# Patient Record
Sex: Female | Born: 1980
Health system: Southern US, Community
[De-identification: ages and names within clinical notes are randomized; demographics above are authoritative.]

## PROBLEM LIST (undated history)

## (undated) DIAGNOSIS — L309 Dermatitis, unspecified: Secondary | ICD-10-CM

## (undated) DIAGNOSIS — D649 Anemia, unspecified: Secondary | ICD-10-CM

## (undated) DIAGNOSIS — D219 Benign neoplasm of connective and other soft tissue, unspecified: Secondary | ICD-10-CM

## (undated) DIAGNOSIS — A749 Chlamydial infection, unspecified: Secondary | ICD-10-CM

## (undated) DIAGNOSIS — G43909 Migraine, unspecified, not intractable, without status migrainosus: Secondary | ICD-10-CM

## (undated) DIAGNOSIS — IMO0002 Reserved for concepts with insufficient information to code with codable children: Secondary | ICD-10-CM

## (undated) HISTORY — DX: Migraine, unspecified, not intractable, without status migrainosus: G43.909

## (undated) HISTORY — DX: Chlamydial infection, unspecified: A74.9

## (undated) HISTORY — DX: Reserved for concepts with insufficient information to code with codable children: IMO0002

## (undated) HISTORY — PX: DENTAL SURGERY: SHX609

## (undated) HISTORY — PX: INCISION AND DRAINAGE BREAST ABSCESS: SUR672

## (undated) HISTORY — DX: Dermatitis, unspecified: L30.9

---

## 2000-07-07 ENCOUNTER — Encounter: Payer: Self-pay | Admitting: Emergency Medicine

## 2000-07-07 ENCOUNTER — Emergency Department (HOSPITAL_COMMUNITY): Admission: EM | Admit: 2000-07-07 | Discharge: 2000-07-07 | Payer: Self-pay | Admitting: Emergency Medicine

## 2002-08-07 ENCOUNTER — Encounter: Admission: RE | Admit: 2002-08-07 | Discharge: 2002-08-07 | Payer: Self-pay | Admitting: Obstetrics and Gynecology

## 2002-08-07 ENCOUNTER — Encounter (INDEPENDENT_AMBULATORY_CARE_PROVIDER_SITE_OTHER): Payer: Self-pay | Admitting: *Deleted

## 2002-08-13 ENCOUNTER — Ambulatory Visit (HOSPITAL_COMMUNITY): Admission: RE | Admit: 2002-08-13 | Discharge: 2002-08-13 | Payer: Self-pay | Admitting: Obstetrics and Gynecology

## 2002-09-11 ENCOUNTER — Encounter: Admission: RE | Admit: 2002-09-11 | Discharge: 2002-09-11 | Payer: Self-pay | Admitting: Obstetrics and Gynecology

## 2002-12-03 ENCOUNTER — Encounter: Payer: Self-pay | Admitting: *Deleted

## 2002-12-03 ENCOUNTER — Ambulatory Visit (HOSPITAL_COMMUNITY): Admission: RE | Admit: 2002-12-03 | Discharge: 2002-12-03 | Payer: Self-pay | Admitting: *Deleted

## 2004-03-20 ENCOUNTER — Emergency Department (HOSPITAL_COMMUNITY): Admission: EM | Admit: 2004-03-20 | Discharge: 2004-03-21 | Payer: Self-pay | Admitting: Emergency Medicine

## 2005-09-04 ENCOUNTER — Emergency Department (HOSPITAL_COMMUNITY): Admission: EM | Admit: 2005-09-04 | Discharge: 2005-09-04 | Payer: Self-pay | Admitting: Emergency Medicine

## 2006-04-25 ENCOUNTER — Encounter: Admission: RE | Admit: 2006-04-25 | Discharge: 2006-04-25 | Payer: Self-pay | Admitting: Internal Medicine

## 2007-06-13 ENCOUNTER — Other Ambulatory Visit: Admission: RE | Admit: 2007-06-13 | Discharge: 2007-06-13 | Payer: Self-pay | Admitting: Gynecology

## 2007-09-11 ENCOUNTER — Ambulatory Visit (HOSPITAL_COMMUNITY): Admission: RE | Admit: 2007-09-11 | Discharge: 2007-09-11 | Payer: Self-pay | Admitting: Obstetrics and Gynecology

## 2008-07-16 ENCOUNTER — Encounter: Payer: Self-pay | Admitting: Gynecology

## 2008-07-16 ENCOUNTER — Ambulatory Visit: Payer: Self-pay | Admitting: Gynecology

## 2008-07-16 ENCOUNTER — Other Ambulatory Visit: Admission: RE | Admit: 2008-07-16 | Discharge: 2008-07-16 | Payer: Self-pay | Admitting: Gynecology

## 2008-08-03 HISTORY — PX: ABDOMINAL SURGERY: SHX537

## 2008-12-13 ENCOUNTER — Ambulatory Visit: Payer: Self-pay | Admitting: Gynecology

## 2009-01-17 ENCOUNTER — Ambulatory Visit: Payer: Self-pay | Admitting: Gynecology

## 2009-09-11 ENCOUNTER — Ambulatory Visit: Payer: Self-pay | Admitting: Gynecology

## 2009-09-11 ENCOUNTER — Other Ambulatory Visit: Admission: RE | Admit: 2009-09-11 | Discharge: 2009-09-11 | Payer: Self-pay | Admitting: Gynecology

## 2010-01-25 ENCOUNTER — Emergency Department (HOSPITAL_COMMUNITY): Admission: EM | Admit: 2010-01-25 | Discharge: 2010-01-25 | Payer: Self-pay | Admitting: Emergency Medicine

## 2010-01-27 ENCOUNTER — Emergency Department (HOSPITAL_COMMUNITY): Admission: EM | Admit: 2010-01-27 | Discharge: 2010-01-27 | Payer: Self-pay | Admitting: Emergency Medicine

## 2010-04-27 ENCOUNTER — Inpatient Hospital Stay (HOSPITAL_COMMUNITY)
Admission: AD | Admit: 2010-04-27 | Discharge: 2010-04-27 | Payer: Self-pay | Source: Home / Self Care | Attending: Gynecology | Admitting: Gynecology

## 2010-04-28 LAB — WET PREP, GENITAL: Trich, Wet Prep: NONE SEEN

## 2010-04-29 LAB — GC/CHLAMYDIA PROBE AMP, GENITAL
Chlamydia, DNA Probe: NEGATIVE
GC Probe Amp, Genital: NEGATIVE

## 2010-06-17 LAB — URINE MICROSCOPIC-ADD ON

## 2010-06-17 LAB — URINALYSIS, ROUTINE W REFLEX MICROSCOPIC
Glucose, UA: NEGATIVE mg/dL
Ketones, ur: 15 mg/dL — AB
Leukocytes, UA: NEGATIVE
Nitrite: NEGATIVE
Protein, ur: 100 mg/dL — AB
Specific Gravity, Urine: 1.034 — ABNORMAL HIGH (ref 1.005–1.030)
Urobilinogen, UA: 1 mg/dL (ref 0.0–1.0)
pH: 6 (ref 5.0–8.0)

## 2010-06-17 LAB — CBC
HCT: 36.1 % (ref 36.0–46.0)
Hemoglobin: 11.9 g/dL — ABNORMAL LOW (ref 12.0–15.0)
MCH: 27.4 pg (ref 26.0–34.0)
MCHC: 33 g/dL (ref 30.0–36.0)
MCV: 83 fL (ref 78.0–100.0)
Platelets: 368 K/uL (ref 150–400)
RBC: 4.35 MIL/uL (ref 3.87–5.11)
RDW: 13.4 % (ref 11.5–15.5)
WBC: 14.2 K/uL — ABNORMAL HIGH (ref 4.0–10.5)

## 2010-06-17 LAB — DIFFERENTIAL
Basophils Absolute: 0 K/uL (ref 0.0–0.1)
Basophils Relative: 0 % (ref 0–1)
Eosinophils Absolute: 0 K/uL (ref 0.0–0.7)
Eosinophils Relative: 0 % (ref 0–5)
Lymphocytes Relative: 24 % (ref 12–46)
Lymphs Abs: 3.4 K/uL (ref 0.7–4.0)
Monocytes Absolute: 1 K/uL (ref 0.1–1.0)
Monocytes Relative: 7 % (ref 3–12)
Neutro Abs: 9.8 K/uL — ABNORMAL HIGH (ref 1.7–7.7)
Neutrophils Relative %: 69 % (ref 43–77)

## 2010-06-17 LAB — BASIC METABOLIC PANEL
BUN: 8 mg/dL (ref 6–23)
CO2: 24 mEq/L (ref 19–32)
Chloride: 105 mEq/L (ref 96–112)
Creatinine, Ser: 0.73 mg/dL (ref 0.4–1.2)
Glucose, Bld: 132 mg/dL — ABNORMAL HIGH (ref 70–99)
Potassium: 3.6 mEq/L (ref 3.5–5.1)

## 2010-09-28 ENCOUNTER — Encounter: Payer: Self-pay | Admitting: Gynecology

## 2010-10-22 ENCOUNTER — Encounter: Payer: Self-pay | Admitting: Gynecology

## 2010-10-28 IMAGING — CT CT NECK W/ CM
1 of 4 series · 7 of 14 positions shown, 9 images · IV contrast (100ml omni 300)
Comparison: None.
COMPARISON: None.

CLINICAL DATA: Right face and neck pain and swelling for the past
5 days.  Increasing dysphagia.  No improvement with antibiotics or
steroid injection.

CT MAXILLOFACIAL WITH CONTRAST
TECHNIQUE: Multidetector CT imaging of the maxillofacial structure
s was performed with
intravenous contrast. Multiplanar CT image reconstructions were als
o generated.  A small metallic BB was placed on the right temple in
order to reliably differentiate right from left.
Contrast:  100 ml Gmnipaque-6MM
TECHNIQUE: Multidetector CT imaging of the neck was performed
using the standard protocol following the bolus administration of
intravenous contrast.
Contrast: 100 ml Gmnipaque-6MM (the same given for the
maxillofacial CT).

[Series 100: maxillofacial · axial · 0.39mm/px · z∈[-349,-166]mm · 7 of 390 slices shown, 9 images]
[im 49/390  soft-tissue]
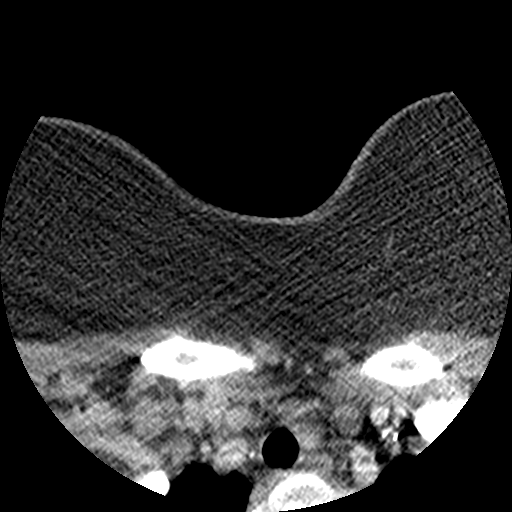
[im 49/390  bone]
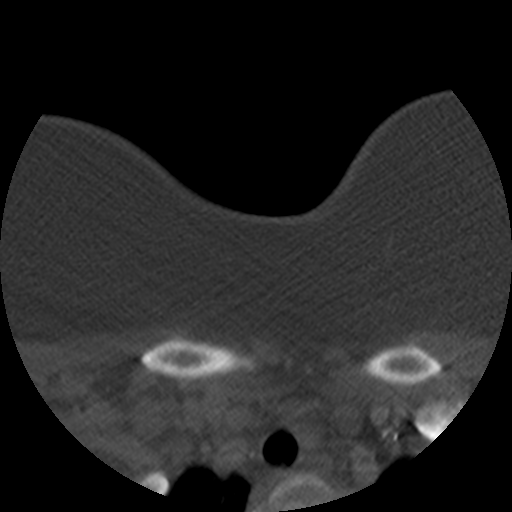
[im 98/390  bone]
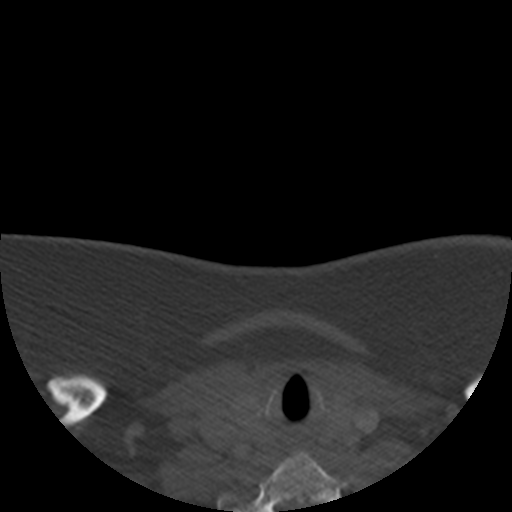
[im 146/390  bone]
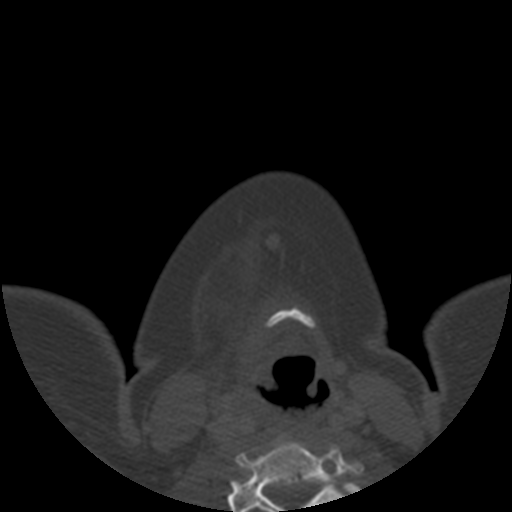
[im 195/390  bone]
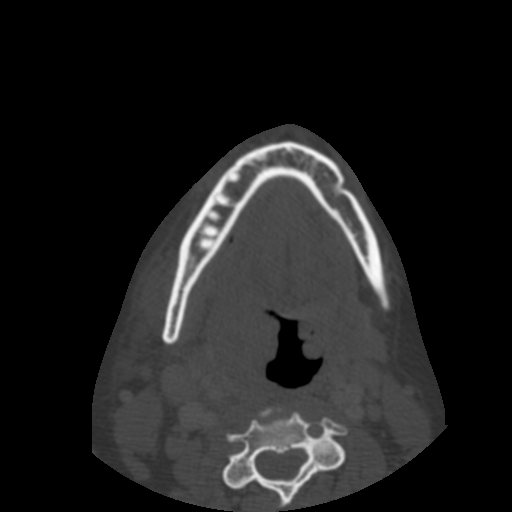
[im 244/390  soft-tissue]
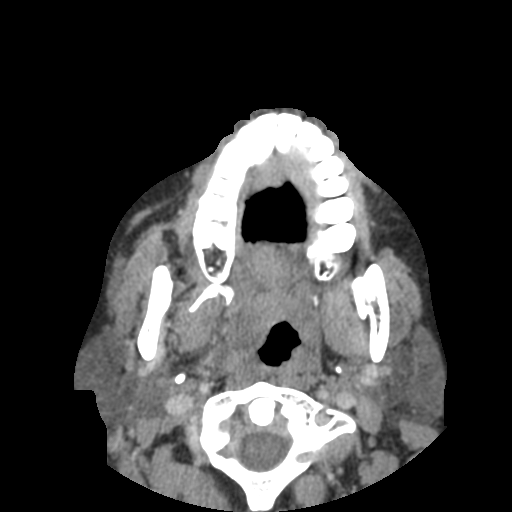
[im 244/390  bone]
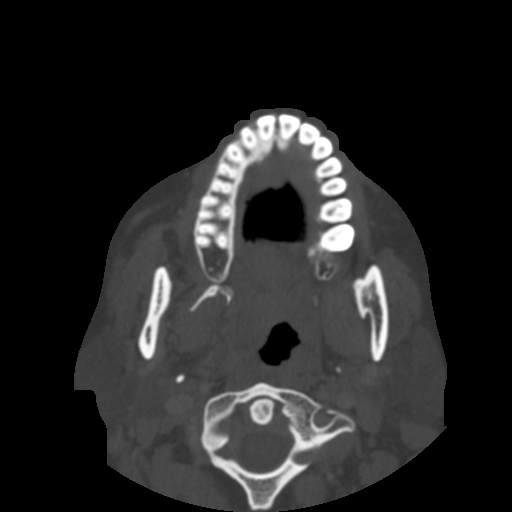
[im 292/390  bone]
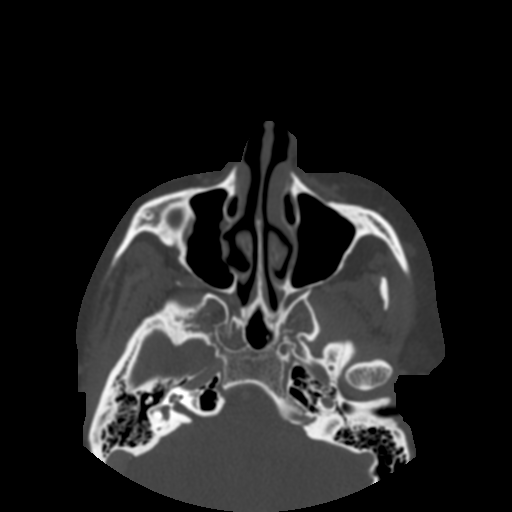
[im 341/390  bone]
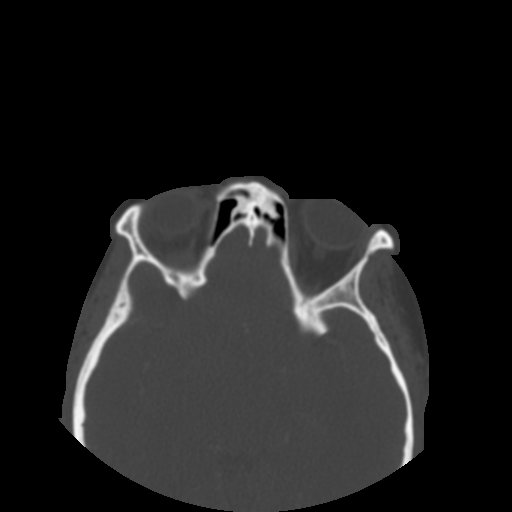

[7 of 14 positions shown; findings below may reference images not displayed]

FINDINGS: Normal appearing maxillofacial bones and soft tissues.
No visible soft tissue swelling.  No fluid collections.  The
paranasal sinuses are normally pneumatized.
IMPRESSION: Normal examination.

CT NECK WITH CONTRAST
FINDINGS: Soft tissue swelling in the region of the right tonsillar
pillar with an oval, central area of low density, measuring 1.1 x
1.0 cm in maximum dimensions on image number 188.  This measures
0.9 cm in length on sagittal reconstruction image number 32.  There
are also prominent left tonsillar soft tissues without abscess.
There is airway narrowing at the level of the posterior oropharynx.
No prevertebral soft  tissue swelling.  Bilateral enlarged
jugulodigastric lymph nodes.  The largest node on the right
measures 2.4 x 1.8 cm in maximum dimensions on image number 209 and
the largest node on the left measures 2.3 x 1.2 cm in maximum
dimensions on image number 174.
IMPRESSION: 1.  Right tonsillitis with a 1.1 cm tonsillar abscess.
2.  Prominent left tonsillar soft tissues without abscess.
3.  Bilateral neck reactive adenopathy.

## 2011-12-05 ENCOUNTER — Emergency Department (HOSPITAL_BASED_OUTPATIENT_CLINIC_OR_DEPARTMENT_OTHER)
Admission: EM | Admit: 2011-12-05 | Discharge: 2011-12-05 | Disposition: A | Discharge status: Home / Self Care | Payer: Self-pay | Attending: Emergency Medicine | Admitting: Emergency Medicine

## 2011-12-05 ENCOUNTER — Encounter (HOSPITAL_BASED_OUTPATIENT_CLINIC_OR_DEPARTMENT_OTHER): Payer: Self-pay | Admitting: *Deleted

## 2011-12-05 DIAGNOSIS — J45909 Unspecified asthma, uncomplicated: Secondary | ICD-10-CM | POA: Insufficient documentation

## 2011-12-05 DIAGNOSIS — J45901 Unspecified asthma with (acute) exacerbation: Secondary | ICD-10-CM

## 2011-12-05 DIAGNOSIS — I10 Essential (primary) hypertension: Secondary | ICD-10-CM | POA: Insufficient documentation

## 2011-12-05 MED ORDER — PREDNISONE 50 MG PO TABS
60.0000 mg | ORAL_TABLET | Freq: Once | ORAL | Status: AC
  Administered 2011-12-05: 60 mg via ORAL
  Filled 2011-12-05: qty 1

## 2011-12-05 MED ORDER — FLUTICASONE PROPIONATE HFA 220 MCG/ACT IN AERO: 1.0000 | INHALATION_SPRAY | Freq: Two times a day (BID) | RESPIRATORY_TRACT | Status: DC

## 2011-12-05 MED ORDER — PREDNISONE 10 MG PO TABS: 20.0000 mg | ORAL_TABLET | Freq: Every day | ORAL | Status: DC

## 2011-12-05 NOTE — ED Notes (Signed)
Pt has hx of asthma and it has been "flaring up" for the last several days. Used Albuterol, but has run out of Flovent. Prod cough with clr-white sputum. BBS-slightly diminished at triage, but pt is in no distress.

## 2011-12-05 NOTE — ED Provider Notes (Addendum)
History     CSN: [id number]  Arrival date & time 12/05/11  1756   First MD Initiated Contact with Patient 12/05/11 1856      Chief Complaint  Patient presents with  . Shortness of Breath    (Consider location/radiation/quality/duration/timing/severity/associated sxs/prior treatment) HPI  Past Medical History  Diagnosis Date  . Asthma    Patient complaining of flare of asthma for several days.  Using albuterol with some relief.  OUt of flovent.  No respirator distress Past Surgical History  Procedure Date  . Abdominal surgery 05/10    ABDOMINAL MYOMECTOMY    Family History  Problem Relation Age of Onset  . Heart disease Father   . Hypertension Father   . Diabetes Father   . Hypertension Sister     History  Substance Use Topics  . Smoking status: Unknown If Ever Smoked  . Smokeless tobacco: Not on file  . Alcohol Use:     OB History    Grav Para Term Preterm Abortions TAB SAB Ect Mult Living                  Review of Systems  Constitutional: Negative for fever, chills, activity change, appetite change and unexpected weight change.  HENT: Negative for sore throat, rhinorrhea, neck pain, neck stiffness and sinus pressure.   Eyes: Negative for visual disturbance.  Respiratory: Positive for cough and wheezing. Negative for shortness of breath.   Cardiovascular: Negative for chest pain and leg swelling.  Gastrointestinal: Negative for vomiting, abdominal pain, diarrhea and blood in stool.  Genitourinary: Negative for dysuria, urgency, frequency, vaginal discharge and difficulty urinating.  Musculoskeletal: Negative for myalgias, arthralgias and gait problem.  Skin: Negative for color change and rash.  Neurological: Negative for weakness, light-headedness and headaches.  Hematological: Does not bruise/bleed easily.  Psychiatric/Behavioral: Negative for dysphoric mood.    Allergies  Review of patient's allergies indicates no known allergies.  Home Medications    Current Outpatient Rx  Name Route Sig Dispense Refill  . ADVIL COLD/SINUS PO Oral Take 1 capsule by mouth daily as needed. For cold symptoms.    Marland Kitchen PSEUDOEPHEDRINE-IBUPROFEN 30-200 MG PO TABS Oral Take by mouth.    Marland Kitchen FLUTICASONE PROPIONATE  HFA 220 MCG/ACT IN AERO Inhalation Inhale 1 puff into the lungs 2 (two) times daily. 1 Inhaler 12  . LORATADINE 10 MG PO TABS Oral Take 10 mg by mouth daily.      Marland Kitchen PREDNISONE 10 MG PO TABS Oral Take 2 tablets (20 mg total) by mouth daily. 15 tablet 0    BP 157/103  Pulse 85  Temp 98.3 F (36.8 C) (Oral)  Resp 20  Ht 5\' 7"  (1.702 m)  Wt 320 lb (145.151 kg)  BMI 50.12 kg/m2  SpO2 99%  LMP 11/25/2011  Physical Exam  Nursing note and vitals reviewed. Constitutional: She appears well-developed and well-nourished.  HENT:  Head: Normocephalic and atraumatic.  Eyes: Conjunctivae normal and EOM are normal. Pupils are equal, round, and reactive to light.  Neck: Normal range of motion. Neck supple.  Cardiovascular: Normal rate, regular rhythm, normal heart sounds and intact distal pulses.   Pulmonary/Chest: Effort normal and breath sounds normal.  Abdominal: Soft. Bowel sounds are normal.  Musculoskeletal: Normal range of motion.  Neurological: She is alert.  Skin: Skin is warm and dry.  Psychiatric: She has a normal mood and affect. Thought content normal.    ED Course  Procedures (including critical care time)   Labs Reviewed  PREGNANCY, URINE   No results found.   1. Asthma attack   2. Hypertension       MDM       Hilario Quarry, MD 12/05/11 1924  Hilario Quarry, MD 12/05/11 9562  Hilario Quarry, MD 02/04/12 1308

## 2013-09-21 ENCOUNTER — Encounter (HOSPITAL_COMMUNITY): Payer: Self-pay | Admitting: *Deleted

## 2013-09-21 ENCOUNTER — Inpatient Hospital Stay (HOSPITAL_COMMUNITY)
Admission: AD | Admit: 2013-09-21 | Discharge: 2013-09-21 | Disposition: A | Discharge status: Home / Self Care | Payer: BC Managed Care – PPO | Source: Ambulatory Visit | Attending: Obstetrics & Gynecology | Admitting: Obstetrics & Gynecology

## 2013-09-21 DIAGNOSIS — R300 Dysuria: Secondary | ICD-10-CM | POA: Insufficient documentation

## 2013-09-21 DIAGNOSIS — Z3202 Encounter for pregnancy test, result negative: Secondary | ICD-10-CM | POA: Insufficient documentation

## 2013-09-21 DIAGNOSIS — E860 Dehydration: Secondary | ICD-10-CM

## 2013-09-21 LAB — URINALYSIS, ROUTINE W REFLEX MICROSCOPIC
Bilirubin Urine: NEGATIVE
Glucose, UA: NEGATIVE mg/dL
Hgb urine dipstick: NEGATIVE
KETONES UR: 15 mg/dL — AB
LEUKOCYTES UA: NEGATIVE
NITRITE: NEGATIVE
PH: 6 (ref 5.0–8.0)
Protein, ur: NEGATIVE mg/dL
SPECIFIC GRAVITY, URINE: 1.025 (ref 1.005–1.030)
UROBILINOGEN UA: 1 mg/dL (ref 0.0–1.0)

## 2013-09-21 LAB — WET PREP, GENITAL
Clue Cells Wet Prep HPF POC: NONE SEEN
TRICH WET PREP: NONE SEEN
Yeast Wet Prep HPF POC: NONE SEEN

## 2013-09-21 LAB — POCT PREGNANCY, URINE: Preg Test, Ur: NEGATIVE

## 2013-09-21 NOTE — Discharge Instructions (Signed)
Dehydration, Adult Dehydration is when you lose more fluids from the body than you take in. Vital organs like the kidneys, brain, and heart cannot function without a proper amount of fluids and salt. Any loss of fluids from the body can cause dehydration.  CAUSES   Vomiting.  Diarrhea.  Excessive sweating.  Excessive urine output.  Fever. SYMPTOMS  Mild dehydration  Thirst.  Dry lips.  Slightly dry mouth. Moderate dehydration  Very dry mouth.  Sunken eyes.  Skin does not bounce back quickly when lightly pinched and released.  Dark urine and decreased urine production.  Decreased tear production.  Headache. Severe dehydration  Very dry mouth.  Extreme thirst.  Rapid, weak pulse (more than 100 beats per minute at rest).  Cold hands and feet.  Not able to sweat in spite of heat and temperature.  Rapid breathing.  Blue lips.  Confusion and lethargy.  Difficulty being awakened.  Minimal urine production.  No tears. DIAGNOSIS  Your caregiver will diagnose dehydration based on your symptoms and your exam. Blood and urine tests will help confirm the diagnosis. The diagnostic evaluation should also identify the cause of dehydration. TREATMENT  Treatment of mild or moderate dehydration can often be done at home by increasing the amount of fluids that you drink. It is best to drink small amounts of fluid more often. Drinking too much at one time can make vomiting worse. Refer to the home care instructions below. Severe dehydration needs to be treated at the hospital where you will probably be given intravenous (IV) fluids that contain water and electrolytes. HOME CARE INSTRUCTIONS   Ask your caregiver about specific rehydration instructions.  Drink enough fluids to keep your urine clear or pale yellow.  Drink small amounts frequently if you have nausea and vomiting.  Eat as you normally do.  Avoid:  Foods or drinks high in sugar.  Carbonated  drinks.  Juice.  Extremely hot or cold fluids.  Drinks with caffeine.  Fatty, greasy foods.  Alcohol.  Tobacco.  Overeating.  Gelatin desserts.  Wash your hands well to avoid spreading bacteria and viruses.  Only take over-the-counter or prescription medicines for pain, discomfort, or fever as directed by your caregiver.  Ask your caregiver if you should continue all prescribed and over-the-counter medicines.  Keep all follow-up appointments with your caregiver. SEEK MEDICAL CARE IF:  You have abdominal pain and it increases or stays in one area (localizes).  You have a rash, stiff neck, or severe headache.  You are irritable, sleepy, or difficult to awaken.  You are weak, dizzy, or extremely thirsty. SEEK IMMEDIATE MEDICAL CARE IF:   You are unable to keep fluids down or you get worse despite treatment.  You have frequent episodes of vomiting or diarrhea.  You have blood or green matter (bile) in your vomit.  You have blood in your stool or your stool looks black and tarry.  You have not urinated in 6 to 8 hours, or you have only urinated a small amount of very dark urine.  You have a fever.  You faint. MAKE SURE YOU:   Understand these instructions.  Will watch your condition.  Will get help right away if you are not doing well or get worse. Document Released: 03/22/2005 Document Revised: 06/14/2011 Document Reviewed: 11/09/2010 [employer] Patient Information 2015 [employer]. This information is not intended to replace advice given to you by your health care provider. Make sure you discuss any questions you have with your health care   provider.  

## 2013-09-21 NOTE — MAU Provider Note (Signed)
History     CSN: [id number]  Arrival date and time: 09/21/13 1925   First Provider Initiated Contact with Patient 09/21/13 2018         Chief Complaint  Patient presents with  . Dysuria   Patient is a 33 y.o. female presenting with dysuria.  Dysuria  Pertinent negatives include no chills, no nausea, no vomiting, no frequency and no urgency.   Pt is not pregnant and presents with dark urine and some pain with urination.  Pt denies vaginal discharge  Or abnormal bleeding.  Pt says she voids adequate amounts and does not have frequency.  Pt denies chills, Fever, or flank pain.   Glynda Jaeger, RN Registered Nurse Signed  MAU Note Service date: 09/21/2013 7:49 PM   For couple days urine very dark and pain when uriate. Symptoms for couple days. I drink lots of water     Past Medical History  Diagnosis Date  . Asthma     Past Surgical History  Procedure Laterality Date  . Abdominal surgery  05/10    ABDOMINAL MYOMECTOMY  . Uterine fibroid surgery  2010    Family History  Problem Relation Age of Onset  . Heart disease Father   . Hypertension Father   . Diabetes Father   . Hypertension Sister     History  Substance Use Topics  . Smoking status: Never Smoker   . Smokeless tobacco: Not on file  . Alcohol Use: No    Allergies: No Known Allergies  Prescriptions prior to admission  Medication Sig Dispense Refill  . albuterol (PROVENTIL HFA;VENTOLIN HFA) 108 (90 BASE) MCG/ACT inhaler Inhale 2 puffs into the lungs every 4 (four) hours as needed for wheezing or shortness of breath.      . fluticasone (FLONASE) 50 MCG/ACT nasal spray Place 2 sprays into both nostrils daily as needed for allergies or rhinitis.      Marland Kitchen ibuprofen (ADVIL,MOTRIN) 200 MG tablet Take 400 mg by mouth every 6 (six) hours as needed for mild pain.        Review of Systems  Constitutional: Negative for fever and chills.  Gastrointestinal: Negative for nausea, vomiting, abdominal pain and diarrhea.   Genitourinary: Positive for dysuria. Negative for urgency and frequency.  Neurological: Negative for headaches.   Physical Exam   Blood pressure 144/93, pulse 94, temperature 98 F (36.7 C), resp. rate 20, height 5\' 8"  (1.727 m), weight 313 lb (141.976 kg), last menstrual period 09/09/2013.  Physical Exam  Nursing note and vitals reviewed. Constitutional: She is oriented to person, place, and time. She appears well-developed and well-nourished. No distress.  HENT:  Head: Normocephalic.  Eyes: Conjunctivae are normal. Pupils are equal, round, and reactive to light.  Neck: Normal range of motion. Neck supple.  Cardiovascular: Normal rate.   Respiratory: Effort normal.  GI: Soft.  Genitourinary:  Small amount of white thin discharge in vault; cervix clean; Bimanual- no palpable enlargement or tenderness - exam complicated by habitus  Musculoskeletal: Normal range of motion.  Neurological: She is alert and oriented to person, place, and time.  Skin: Skin is warm and dry.  Psychiatric: She has a normal mood and affect.   Results for orders placed during the hospital encounter of 09/21/13 (from the past 24 hour(s))  URINALYSIS, ROUTINE W REFLEX MICROSCOPIC     Status: Abnormal   Collection Time    09/21/13  7:53 PM      Result Value Ref Range   Color, Urine YELLOW  YELLOW  APPearance CLEAR  CLEAR   Specific Gravity, Urine 1.025  1.005 - 1.030   pH 6.0  5.0 - 8.0   Glucose, UA NEGATIVE  NEGATIVE mg/dL   Hgb urine dipstick NEGATIVE  NEGATIVE   Bilirubin Urine NEGATIVE  NEGATIVE   Ketones, ur 15 (*) NEGATIVE mg/dL   Protein, ur NEGATIVE  NEGATIVE mg/dL   Urobilinogen, UA 1.0  0.0 - 1.0 mg/dL   Nitrite NEGATIVE  NEGATIVE   Leukocytes, UA NEGATIVE  NEGATIVE  POCT PREGNANCY, URINE     Status: None   Collection Time    09/21/13  8:00 PM      Result Value Ref Range   Preg Test, Ur NEGATIVE  NEGATIVE  WET PREP, GENITAL     Status: Abnormal   Collection Time    09/21/13  8:31 PM       Result Value Ref Range   Yeast Wet Prep HPF POC NONE SEEN  NONE SEEN   Trich, Wet Prep NONE SEEN  NONE SEEN   Clue Cells Wet Prep HPF POC NONE SEEN  NONE SEEN   WBC, Wet Prep HPF POC FEW (*) NONE SEEN    MAU Course  Procedures Care turned over to [facility], [place] Sherolyn Buba 09/21/2013, 8:01 PM   2045 - Care assumed from Sherolyn Buba, NP UPT - negative UA, wet prep and GC/Chlamydia today  Assessment and Plan  A: Dehydration  P: Discharge home Patient advised to increased PO hydration as tolerated Patient may return to MAU as needed or if her condition were to change or worsen  Farris Has, PA-C 09/21/2013 9:02 PM

## 2013-09-21 NOTE — MAU Note (Signed)
For couple days urine very dark and pain when uriate. Symptoms for couple days. I drink lots of water

## 2013-09-21 NOTE — MAU Note (Signed)
Pt. Here for darker urine that she has had for the past few days. Pt. Also reports pain in urethra while urinating and after voiding. Pt. States she has been hydrating well with water and cranberry juice.

## 2013-09-22 LAB — GC/CHLAMYDIA PROBE AMP
CT Probe RNA: POSITIVE — AB
GC PROBE AMP APTIMA: NEGATIVE

## 2013-09-23 LAB — URINE CULTURE

## 2013-09-24 ENCOUNTER — Telehealth: Payer: Self-pay

## 2013-09-24 DIAGNOSIS — A749 Chlamydial infection, unspecified: Secondary | ICD-10-CM

## 2013-09-24 MED ORDER — AZITHROMYCIN 500 MG PO TABS: 1000.0000 mg | ORAL_TABLET | Freq: Once | ORAL | Status: DC

## 2013-09-24 NOTE — Telephone Encounter (Signed)
Message copied by Geanie Logan on Mon Sep 24, 2013 11:11 AM ------      Message from: Tarry Kos      Created: Mon Sep 24, 2013 10:29 AM       Patient returned call, notified her of positive chlamydia culture.  Patient has not been treated and will need Rx called in per protocol to [facility].  Instructed patient to notify her partner for treatment. ------

## 2013-09-24 NOTE — Telephone Encounter (Signed)
Zithromax 1gm PO prescribed. Patient called and informed. No questions or concerns.

## 2013-09-26 ENCOUNTER — Telehealth: Payer: Self-pay

## 2013-09-26 NOTE — Telephone Encounter (Signed)
Patient called requesting a RX for Diflucan as she was given an ABX Monday and frequently has yeast infections after ABX. Per chart review, she is not a patient of ours-- was seen in MAU 6/19 and we sent RX for Zithromax for treatment of chlamydia. Called patient to inform her that she should seek RX from PCP or use an OTC should she develop yeast infection. Patient verbalized understanding and gratitude. No questions or concerns.

## 2013-10-22 ENCOUNTER — Inpatient Hospital Stay (HOSPITAL_COMMUNITY)
Admission: AD | Admit: 2013-10-22 | Discharge: 2013-10-22 | Disposition: A | Discharge status: Home / Self Care | Payer: BC Managed Care – PPO | Source: Ambulatory Visit | Attending: Obstetrics & Gynecology | Admitting: Obstetrics & Gynecology

## 2013-10-22 ENCOUNTER — Encounter (HOSPITAL_COMMUNITY): Payer: Self-pay | Admitting: *Deleted

## 2013-10-22 DIAGNOSIS — R109 Unspecified abdominal pain: Secondary | ICD-10-CM | POA: Insufficient documentation

## 2013-10-22 DIAGNOSIS — B373 Candidiasis of vulva and vagina: Secondary | ICD-10-CM

## 2013-10-22 DIAGNOSIS — B3731 Acute candidiasis of vulva and vagina: Secondary | ICD-10-CM | POA: Insufficient documentation

## 2013-10-22 DIAGNOSIS — N390 Urinary tract infection, site not specified: Secondary | ICD-10-CM | POA: Insufficient documentation

## 2013-10-22 HISTORY — DX: Benign neoplasm of connective and other soft tissue, unspecified: D21.9

## 2013-10-22 LAB — URINALYSIS, ROUTINE W REFLEX MICROSCOPIC
BILIRUBIN URINE: NEGATIVE
Glucose, UA: NEGATIVE mg/dL
HGB URINE DIPSTICK: NEGATIVE
KETONES UR: NEGATIVE mg/dL
Nitrite: NEGATIVE
PROTEIN: NEGATIVE mg/dL
SPECIFIC GRAVITY, URINE: 1.015 (ref 1.005–1.030)
UROBILINOGEN UA: 2 mg/dL — AB (ref 0.0–1.0)
pH: 7.5 (ref 5.0–8.0)

## 2013-10-22 LAB — WET PREP, GENITAL
Trich, Wet Prep: NONE SEEN
WBC, Wet Prep HPF POC: NONE SEEN
Yeast Wet Prep HPF POC: NONE SEEN

## 2013-10-22 LAB — URINE MICROSCOPIC-ADD ON

## 2013-10-22 LAB — POCT PREGNANCY, URINE: Preg Test, Ur: NEGATIVE

## 2013-10-22 MED ORDER — FLUCONAZOLE 150 MG PO TABS: 150.0000 mg | ORAL_TABLET | ORAL | Status: DC | PRN

## 2013-10-22 MED ORDER — CIPROFLOXACIN HCL 500 MG PO TABS: 500.0000 mg | ORAL_TABLET | Freq: Two times a day (BID) | ORAL | Status: DC

## 2013-10-22 MED ORDER — PHENAZOPYRIDINE HCL 200 MG PO TABS: 200.0000 mg | ORAL_TABLET | Freq: Three times a day (TID) | ORAL | Status: DC | PRN

## 2013-10-22 NOTE — MAU Provider Note (Signed)
Chief Complaint: Dysuria and Abdominal Pain  First Provider Initiated Contact with Patient 10/22/13 2006     SUBJECTIVE HPI: Alexandra Morgan is a 33 y.o. [id number] female who presents with frequency, urgency, hematuria, low abd pain x 5 days. Increased discharge today. Denied fever, chills, flank pain. Pos CT 09/21/2013. Pt and partner Tx'd.   Past Medical History  Diagnosis Date  . Asthma   . Fibroid    OB History  Gravida Para Term Preterm AB SAB TAB Ectopic Multiple Living  1    1  1    0    # Outcome Date GA Lbr Len/2nd Weight Sex Delivery Anes PTL Lv  1 TAB 2009             Past Surgical History  Procedure Laterality Date  . Abdominal surgery  05/10    ABDOMINAL MYOMECTOMY  . Uterine fibroid surgery  2010  . Tonsillectomy     History   Social History  . Marital Status: Single    Spouse Name: N/A    Number of Children: N/A  . Years of Education: N/A   Occupational History  . Not on file.   Social History Main Topics  . Smoking status: Never Smoker   . Smokeless tobacco: Not on file  . Alcohol Use: No  . Drug Use: No  . Sexual Activity: Yes    Birth Control/ Protection: None   Other Topics Concern  . Not on file   Social History Narrative  . No narrative on file   No current facility-administered medications on file prior to encounter.   Current Outpatient Prescriptions on File Prior to Encounter  Medication Sig Dispense Refill  . fluticasone (FLONASE) 50 MCG/ACT nasal spray Place 2 sprays into both nostrils daily as needed for allergies or rhinitis.      Marland Kitchen ibuprofen (ADVIL,MOTRIN) 200 MG tablet Take 400 mg by mouth every 6 (six) hours as needed for mild pain.      Marland Kitchen albuterol (PROVENTIL HFA;VENTOLIN HFA) 108 (90 BASE) MCG/ACT inhaler Inhale 2 puffs into the lungs every 4 (four) hours as needed for wheezing or shortness of breath.       No Known Allergies  ROS: Pertinent items in HPI.   OBJECTIVE Blood pressure 143/87, pulse 91, temperature 99.2 F (37.3  C), temperature source Oral, resp. rate 18, height 5\' 5"  (1.651 m), weight 139.436 kg (307 lb 6.4 oz), last menstrual period 10/08/2013, SpO2 100.00%. GENERAL: Well-developed, well-nourished female in no acute distress.  HEENT: Normocephalic HEART: normal rate RESP: normal effort ABDOMEN: Soft, non-tender. No CVAT.  EXTREMITIES: Nontender, no edema NEURO: Alert and oriented SPECULUM EXAM: NEFG, moderate amount of thick, curd-like, odorless discharge, no blood noted, cervix clean BIMANUAL: cervix closed; uterus normal size, no adnexal tenderness or masses. No CMT.   LAB RESULTS Results for orders placed during the hospital encounter of 10/22/13 (from the past 24 hour(s))  URINALYSIS, ROUTINE W REFLEX MICROSCOPIC     Status: Abnormal   Collection Time    10/22/13  7:18 PM      Result Value Ref Range   Color, Urine YELLOW  YELLOW   APPearance HAZY (*) CLEAR   Specific Gravity, Urine 1.015  1.005 - 1.030   pH 7.5  5.0 - 8.0   Glucose, UA NEGATIVE  NEGATIVE mg/dL   Hgb urine dipstick NEGATIVE  NEGATIVE   Bilirubin Urine NEGATIVE  NEGATIVE   Ketones, ur NEGATIVE  NEGATIVE mg/dL   Protein, ur NEGATIVE  NEGATIVE mg/dL  Urobilinogen, UA 2.0 (*) 0.0 - 1.0 mg/dL   Nitrite NEGATIVE  NEGATIVE   Leukocytes, UA TRACE (*) NEGATIVE  URINE MICROSCOPIC-ADD ON     Status: Abnormal   Collection Time    10/22/13  7:18 PM      Result Value Ref Range   Squamous Epithelial / LPF MANY (*) RARE   WBC, UA 3-6  <3 WBC/hpf   RBC / HPF 0-2  <3 RBC/hpf   Bacteria, UA RARE  RARE  POCT PREGNANCY, URINE     Status: None   Collection Time    10/22/13  7:29 PM      Result Value Ref Range   Preg Test, Ur NEGATIVE  NEGATIVE  WET PREP, GENITAL     Status: Abnormal   Collection Time    10/22/13  8:25 PM      Result Value Ref Range   Yeast Wet Prep HPF POC NONE SEEN  NONE SEEN   Trich, Wet Prep NONE SEEN  NONE SEEN   Clue Cells Wet Prep HPF POC FEW (*) NONE SEEN   WBC, Wet Prep HPF POC NONE SEEN  NONE SEEN     IMAGING No results found.  MAU COURSE  ASSESSMENT 1. UTI (lower urinary tract infection)   2. Vaginal yeast infection     PLAN Discharge home in stable condition.  GC/CT pending     Follow-up Information   Follow up with Gynecologist. (As needed if gynecologic symptoms worsen)       Follow up with Primary care provider. (If urinary symptoms worsen)        Medication List         albuterol 108 (90 BASE) MCG/ACT inhaler  Commonly known as:  PROVENTIL HFA;VENTOLIN HFA  Inhale 2 puffs into the lungs every 4 (four) hours as needed for wheezing or shortness of breath.     ciprofloxacin 500 MG tablet  Commonly known as:  CIPRO  Take 1 tablet (500 mg total) by mouth 2 (two) times daily.     CRANBERRY PO  Take 2 capsules by mouth daily.     fluconazole 150 MG tablet  Commonly known as:  DIFLUCAN  Take 1 tablet (150 mg total) by mouth every three (3) days as needed.     fluticasone 50 MCG/ACT nasal spray  Commonly known as:  FLONASE  Place 2 sprays into both nostrils daily as needed for allergies or rhinitis.     ibuprofen 200 MG tablet  Commonly known as:  ADVIL,MOTRIN  Take 400 mg by mouth every 6 (six) hours as needed for mild pain.     phenazopyridine 200 MG tablet  Commonly known as:  PYRIDIUM  Take 1 tablet (200 mg total) by mouth 3 (three) times daily as needed for pain.       [place], [place] 10/22/2013  9:24 PM

## 2013-10-22 NOTE — MAU Note (Signed)
Pt c/o painful urination and abdominal pain that has been going on since last Thursday. Has urgency and increased frequency with urination.

## 2013-10-22 NOTE — Discharge Instructions (Signed)
Urinary Tract Infection Urinary tract infections (UTIs) can develop anywhere along your urinary tract. Your urinary tract is your body's drainage system for removing wastes and extra water. Your urinary tract includes two kidneys, two ureters, a bladder, and a urethra. Your kidneys are a pair of bean-shaped organs. Each kidney is about the size of your fist. They are located below your ribs, one on each side of your spine. CAUSES Infections are caused by microbes, which are microscopic organisms, including fungi, viruses, and bacteria. These organisms are so small that they can only be seen through a microscope. Bacteria are the microbes that most commonly cause UTIs. SYMPTOMS  Symptoms of UTIs may vary by age and gender of the patient and by the location of the infection. Symptoms in young women typically include a frequent and intense urge to urinate and a painful, burning feeling in the bladder or urethra during urination. Older women and men are more likely to be tired, shaky, and weak and have muscle aches and abdominal pain. A fever may mean the infection is in your kidneys. Other symptoms of a kidney infection include pain in your back or sides below the ribs, nausea, and vomiting. DIAGNOSIS To diagnose a UTI, your caregiver will ask you about your symptoms. Your caregiver also will ask to provide a urine sample. The urine sample will be tested for bacteria and white blood cells. White blood cells are made by your body to help fight infection. TREATMENT  Typically, UTIs can be treated with medication. Because most UTIs are caused by a bacterial infection, they usually can be treated with the use of antibiotics. The choice of antibiotic and length of treatment depend on your symptoms and the type of bacteria causing your infection. HOME CARE INSTRUCTIONS  If you were prescribed antibiotics, take them exactly as your caregiver instructs you. Finish the medication even if you feel better after you  have only taken some of the medication.  Drink enough water and fluids to keep your urine clear or pale yellow.  Avoid caffeine, tea, and carbonated beverages. They tend to irritate your bladder.  Empty your bladder often. Avoid holding urine for long periods of time.  Empty your bladder before and after sexual intercourse.  After a bowel movement, women should cleanse from front to back. Use each tissue only once. SEEK MEDICAL CARE IF:   You have back pain.  You develop a fever.  Your symptoms do not begin to resolve within 3 days. SEEK IMMEDIATE MEDICAL CARE IF:   You have severe back pain or lower abdominal pain.  You develop chills.  You have nausea or vomiting.  You have continued burning or discomfort with urination. MAKE SURE YOU:   Understand these instructions.  Will watch your condition.  Will get help right away if you are not doing well or get worse. Document Released: 12/30/2004 Document Revised: 09/21/2011 Document Reviewed: 04/30/2011 [facility] Patient Information 2015 [place], [place]. This information is not intended to replace advice given to you by your health care provider. Make sure you discuss any questions you have with your health care provider.  Candidal Vulvovaginitis Candidal vulvovaginitis is an infection of the vagina and vulva. The vulva is the skin around the opening of the vagina. This may cause itching and discomfort in and around the vagina.  HOME CARE  Only take medicine as told by your doctor.  Do not have sex (intercourse) until the infection is healed or as told by your doctor.  Practice safe  sex.  Tell your sex partner about your infection.  Do not douche or use tampons.  Wear cotton underwear. Do not wear tight pants or panty hose.  Eat yogurt. This may help treat and prevent yeast infections. GET HELP RIGHT AWAY IF:   You have a fever.  Your problems get worse during treatment or do not get better in 3 days.  You have  discomfort, irritation, or itching in your vagina or vulva area.  You have pain after sex.  You start to get belly (abdominal) pain. MAKE SURE YOU:  Understand these instructions.  Will watch your condition.  Will get help right away if you are not doing well or get worse. Document Released: 06/18/2008 Document Revised: 03/27/2013 Document Reviewed: 06/18/2008 [facility] Patient Information 2015 [place], [place]. This information is not intended to replace advice given to you by your health care provider. Make sure you discuss any questions you have with your health care provider.

## 2013-10-23 LAB — GC/CHLAMYDIA PROBE AMP
CT Probe RNA: NEGATIVE
GC PROBE AMP APTIMA: NEGATIVE

## 2013-10-24 NOTE — MAU Provider Note (Signed)
Attestation of Attending Supervision of Advanced Practitioner (CNM/NP): Evaluation and management procedures were performed by the Advanced Practitioner under my supervision and collaboration.  I have reviewed the Advanced Practitioner's note and chart, and I agree with the management and plan.  HARRAWAY-SMITH, Abdelaziz Westenberger 8:40 AM

## 2013-10-25 LAB — URINE CULTURE: SPECIAL REQUESTS: NORMAL

## 2013-12-15 ENCOUNTER — Encounter (HOSPITAL_COMMUNITY): Payer: Self-pay | Admitting: *Deleted

## 2013-12-15 ENCOUNTER — Inpatient Hospital Stay (HOSPITAL_COMMUNITY)
Admission: AD | Admit: 2013-12-15 | Discharge: 2013-12-15 | Disposition: A | Discharge status: Home / Self Care | Payer: BC Managed Care – PPO | Source: Ambulatory Visit | Attending: Obstetrics & Gynecology | Admitting: Obstetrics & Gynecology

## 2013-12-15 DIAGNOSIS — N898 Other specified noninflammatory disorders of vagina: Secondary | ICD-10-CM | POA: Insufficient documentation

## 2013-12-15 DIAGNOSIS — N644 Mastodynia: Secondary | ICD-10-CM | POA: Insufficient documentation

## 2013-12-15 DIAGNOSIS — N611 Abscess of the breast and nipple: Secondary | ICD-10-CM

## 2013-12-15 DIAGNOSIS — L293 Anogenital pruritus, unspecified: Secondary | ICD-10-CM | POA: Diagnosis not present

## 2013-12-15 DIAGNOSIS — N61 Mastitis without abscess: Secondary | ICD-10-CM | POA: Diagnosis not present

## 2013-12-15 LAB — URINALYSIS, ROUTINE W REFLEX MICROSCOPIC
BILIRUBIN URINE: NEGATIVE
Glucose, UA: NEGATIVE mg/dL
HGB URINE DIPSTICK: NEGATIVE
Ketones, ur: NEGATIVE mg/dL
Leukocytes, UA: NEGATIVE
Nitrite: NEGATIVE
Protein, ur: NEGATIVE mg/dL
Specific Gravity, Urine: 1.015 (ref 1.005–1.030)
UROBILINOGEN UA: 1 mg/dL (ref 0.0–1.0)
pH: 8 (ref 5.0–8.0)

## 2013-12-15 LAB — CBC
HEMATOCRIT: 35.6 % — AB (ref 36.0–46.0)
Hemoglobin: 11.6 g/dL — ABNORMAL LOW (ref 12.0–15.0)
MCH: 27.4 pg (ref 26.0–34.0)
MCHC: 32.6 g/dL (ref 30.0–36.0)
MCV: 84 fL (ref 78.0–100.0)
Platelets: 354 10*3/uL (ref 150–400)
RBC: 4.24 MIL/uL (ref 3.87–5.11)
RDW: 13.7 % (ref 11.5–15.5)
WBC: 7.3 10*3/uL (ref 4.0–10.5)

## 2013-12-15 LAB — HIV ANTIBODY (ROUTINE TESTING W REFLEX): HIV: NONREACTIVE

## 2013-12-15 LAB — WET PREP, GENITAL
Trich, Wet Prep: NONE SEEN
Yeast Wet Prep HPF POC: NONE SEEN

## 2013-12-15 LAB — POCT PREGNANCY, URINE: Preg Test, Ur: NEGATIVE

## 2013-12-15 MED ORDER — FLUCONAZOLE 150 MG PO TABS: 150.0000 mg | ORAL_TABLET | Freq: Every day | ORAL | Status: DC

## 2013-12-15 MED ORDER — SULFAMETHOXAZOLE-TMP DS 800-160 MG PO TABS: 1.0000 | ORAL_TABLET | Freq: Two times a day (BID) | ORAL | Status: DC

## 2013-12-15 NOTE — MAU Provider Note (Signed)
History     CSN: [id number]  Arrival date and time: 12/15/13 6811   First Provider Initiated Contact with Patient 12/15/13 1027      Chief Complaint  Patient presents with  . Vaginal Itching  . Breast Pain   HPI  Ms. Alexandra Morgan is a. 33 y.o. female G1P0010 who presents with abnormal vaginal discharge, vaginal itching and breast pain. In the past she was told that she has an inflamed sweat gland on her right breast. The area is hard, tender, red and is draining.   OB History   Grav Para Term Preterm Abortions TAB SAB Ect Mult Living   1    1 1     0      Past Medical History  Diagnosis Date  . Asthma   . Fibroid     Past Surgical History  Procedure Laterality Date  . Abdominal surgery  05/10    ABDOMINAL MYOMECTOMY  . Uterine fibroid surgery  2010  . Tonsillectomy      Family History  Problem Relation Age of Onset  . Heart disease Father   . Hypertension Father   . Diabetes Father   . Hypertension Sister     History  Substance Use Topics  . Smoking status: Never Smoker   . Smokeless tobacco: Not on file  . Alcohol Use: No    Allergies: No Known Allergies  Prescriptions prior to admission  Medication Sig Dispense Refill  . folic acid (FOLVITE) 572 MCG tablet Take 400 mcg by mouth daily.      Marland Kitchen ibuprofen (ADVIL,MOTRIN) 200 MG tablet Take 400 mg by mouth every 6 (six) hours as needed for mild pain.      . Lactobacillus (ACIDOPHILUS PO) Take 1 tablet by mouth daily.      Marland Kitchen loratadine-pseudoephedrine (CLARITIN-D 24-HOUR) 10-240 MG per 24 hr tablet Take 1 tablet by mouth daily as needed for allergies.      Marland Kitchen albuterol (PROVENTIL HFA;VENTOLIN HFA) 108 (90 BASE) MCG/ACT inhaler Inhale 2 puffs into the lungs every 4 (four) hours as needed for wheezing or shortness of breath.      . phenazopyridine (PYRIDIUM) 200 MG tablet Take 1 tablet (200 mg total) by mouth 3 (three) times daily as needed for pain.  12 tablet  0   Results for orders placed during the  hospital encounter of 12/15/13 (from the past 48 hour(s))  URINALYSIS, ROUTINE W REFLEX MICROSCOPIC     Status: None   Collection Time    12/15/13  9:21 AM      Result Value Ref Range   Color, Urine YELLOW  YELLOW   APPearance CLEAR  CLEAR   Specific Gravity, Urine 1.015  1.005 - 1.030   pH 8.0  5.0 - 8.0   Glucose, UA NEGATIVE  NEGATIVE mg/dL   Hgb urine dipstick NEGATIVE  NEGATIVE   Bilirubin Urine NEGATIVE  NEGATIVE   Ketones, ur NEGATIVE  NEGATIVE mg/dL   Protein, ur NEGATIVE  NEGATIVE mg/dL   Urobilinogen, UA 1.0  0.0 - 1.0 mg/dL   Nitrite NEGATIVE  NEGATIVE   Leukocytes, UA NEGATIVE  NEGATIVE   Comment: MICROSCOPIC NOT DONE ON URINES WITH NEGATIVE PROTEIN, BLOOD, LEUKOCYTES, NITRITE, OR GLUCOSE <1000 mg/dL.  POCT PREGNANCY, URINE     Status: None   Collection Time    12/15/13  9:31 AM      Result Value Ref Range   Preg Test, Ur NEGATIVE  NEGATIVE   Comment:  THE SENSITIVITY OF THIS     METHODOLOGY IS >24 mIU/mL  WET PREP, GENITAL     Status: Abnormal   Collection Time    12/15/13 10:40 AM      Result Value Ref Range   Yeast Wet Prep HPF POC NONE SEEN  NONE SEEN   Trich, Wet Prep NONE SEEN  NONE SEEN   Clue Cells Wet Prep HPF POC FEW (*) NONE SEEN   WBC, Wet Prep HPF POC FEW (*) NONE SEEN   Comment: FEW BACTERIA SEEN  CBC     Status: Abnormal   Collection Time    12/15/13 10:45 AM      Result Value Ref Range   WBC 7.3  4.0 - 10.5 K/uL   RBC 4.24  3.87 - 5.11 MIL/uL   Hemoglobin 11.6 (*) 12.0 - 15.0 g/dL   HCT 35.6 (*) 36.0 - 46.0 %   MCV 84.0  78.0 - 100.0 fL   MCH 27.4  26.0 - 34.0 pg   MCHC 32.6  30.0 - 36.0 g/dL   RDW 13.7  11.5 - 15.5 %   Platelets 354  150 - 400 K/uL    Review of Systems  Constitutional: Negative for fever and chills.  Gastrointestinal: Negative for nausea, vomiting and abdominal pain.  Genitourinary: Negative for dysuria, urgency and frequency.       + vaginal discharge. No vaginal bleeding. No dysuria.   Skin:        Right breast bump    Physical Exam   Blood pressure 149/87, pulse 90, temperature 98.3 F (36.8 C), temperature source Oral, resp. rate 17.  Physical Exam  Constitutional: She is oriented to person, place, and time. She appears well-developed and well-nourished. No distress.  HENT:  Head: Normocephalic.  Eyes: Pupils are equal, round, and reactive to light.  Neck: Neck supple.  Respiratory: Effort normal. She exhibits tenderness and swelling.    To the right, just above xyphoid process is a quarter size abscess; firm with erythematous borders. Tender to touch and oozing. No odor.   GI: Soft. She exhibits no distension. There is no tenderness. There is no rebound and no guarding.  Genitourinary:  Speculum exam: Vagina - Small amount of creamy discharge, no odor Cervix - No contact bleeding Bimanual exam: Cervix closed Uterus non tender, normal size Adnexa non tender, no masses bilaterally GC/Chlam, wet prep done Chaperone present for exam.   Musculoskeletal: Normal range of motion.  Neurological: She is alert and oriented to person, place, and time.  Skin: Skin is warm. She is not diaphoretic.  Psychiatric: Her behavior is normal.    MAU Course  Procedures None  MDM CBC UA  Dr. Harolyn Rutherford consulted due to ? Cellulitis/abscess of right breast Consulted with ER physician at [facility] ED who recommends consult with General Surgery before sending to ED.  Per Dr. Leighton Ruff patient is to start antibiotics and call the urgent appointment line on Monday. If symptoms worsen, patient is to go to [facility] ED  Assessment and Plan   A:  1. Breast abscess    P:  Discharge home in stable condition  Patient is to call the urgent appointment line with General Surgery on Monday; phone number provided  Return to [facility] ED with signs of infection; fever, chills, purulent drainage RX: Bactrim DS        Diflucan (per patient request; pt is prone to yeast infections  following antibiotic use)  Ronnald Ramp, NP  12/15/2013, 4:37 PM

## 2013-12-15 NOTE — MAU Note (Signed)
Noticed an abscess last Saturday under her right breast.  Used warm compresses, neosporin and gauze.  Has some white discharge and vaginal itching as well. Denies VB.

## 2013-12-15 NOTE — MAU Provider Note (Signed)
Attestation of Attending Supervision of Advanced Practitioner (PA/CNM/NP): Evaluation and management procedures were performed by the Advanced Practitioner under my supervision and collaboration.  I have reviewed the Advanced Practitioner's note and chart, and I agree with the management and plan.  Tamirah George, MD, FACOG Attending Obstetrician & Gynecologist Faculty Practice, [facility]   

## 2013-12-15 NOTE — Discharge Instructions (Signed)
Abscess An abscess (boil or furuncle) is an infected area on or under the skin. This area is filled with yellowish-white fluid (pus) and other material (debris). HOME CARE   Only take medicines as told by your doctor.  If you were given antibiotic medicine, take it as directed. Finish the medicine even if you start to feel better.  If gauze is used, follow your doctor's directions for changing the gauze.  To avoid spreading the infection:  Keep your abscess covered with a bandage.  Wash your hands well.  Do not share personal care items, towels, or whirlpools with others.  Avoid skin contact with others.  Keep your skin and clothes clean around the abscess.  Keep all doctor visits as told. GET HELP RIGHT AWAY IF:   You have more pain, puffiness (swelling), or redness in the wound site.  You have more fluid or blood coming from the wound site.  You have muscle aches, chills, or you feel sick.  You have a fever. MAKE SURE YOU:   Understand these instructions.  Will watch your condition.  Will get help right away if you are not doing well or get worse. Document Released: 09/08/2007 Document Revised: 09/21/2011 Document Reviewed: 06/04/2011 [facility] Patient Information 2015 [place], [place]. This information is not intended to replace advice given to you by your health care provider. Make sure you discuss any questions you have with your health care provider.  Cellulitis Cellulitis is an infection of the skin and the tissue beneath it. The infected area is usually red and tender. Cellulitis occurs most often in the arms and lower legs.  CAUSES  Cellulitis is caused by bacteria that enter the skin through cracks or cuts in the skin. The most common types of bacteria that cause cellulitis are staphylococci and streptococci. SIGNS AND SYMPTOMS   Redness and warmth.  Swelling.  Tenderness or pain.  Fever. DIAGNOSIS  Your health care provider can usually determine what  is wrong based on a physical exam. Blood tests may also be done. TREATMENT  Treatment usually involves taking an antibiotic medicine. HOME CARE INSTRUCTIONS   Take your antibiotic medicine as directed by your health care provider. Finish the antibiotic even if you start to feel better.  Keep the infected arm or leg elevated to reduce swelling.  Apply a warm cloth to the affected area up to 4 times per day to relieve pain.  Take medicines only as directed by your health care provider.  Keep all follow-up visits as directed by your health care provider. SEEK MEDICAL CARE IF:   You notice red streaks coming from the infected area.  Your red area gets larger or turns dark in color.  Your bone or joint underneath the infected area becomes painful after the skin has healed.  Your infection returns in the same area or another area.  You notice a swollen bump in the infected area.  You develop new symptoms.  You have a fever. SEEK IMMEDIATE MEDICAL CARE IF:   You feel very sleepy.  You develop vomiting or diarrhea.  You have a general ill feeling (malaise) with muscle aches and pains. MAKE SURE YOU:   Understand these instructions.  Will watch your condition.  Will get help right away if you are not doing well or get worse. Document Released: 12/30/2004 Document Revised: 08/06/2013 Document Reviewed: 06/07/2011 [facility] Patient Information 2015 [place], [place]. This information is not intended to replace advice given to you by your health care provider. Make sure  you discuss any questions you have with your health care provider.

## 2013-12-17 LAB — GC/CHLAMYDIA PROBE AMP
CT Probe RNA: NEGATIVE
GC Probe RNA: NEGATIVE

## 2013-12-19 ENCOUNTER — Telehealth (INDEPENDENT_AMBULATORY_CARE_PROVIDER_SITE_OTHER): Payer: Self-pay

## 2013-12-19 NOTE — Telephone Encounter (Signed)
Pt s/p I & D of right breast abscess on 12/17/13. Pt is calling today to see if she can get a RX for pain meds. Pt states that she has been working and in the evenings pain is unbearable. Pt rates her pain in the evening a 8 out of 10. Advised pt that she can also take up to 800mg  of Ibuprofen every 8 hours as needed. Informed pt that I would send Dr Marlou Starks a message and we would contact her as soon as we receive a response. Pt verbalized understanding.

## 2014-02-04 ENCOUNTER — Encounter (HOSPITAL_COMMUNITY): Payer: Self-pay | Admitting: *Deleted

## 2014-04-22 ENCOUNTER — Ambulatory Visit (INDEPENDENT_AMBULATORY_CARE_PROVIDER_SITE_OTHER): Payer: BLUE CROSS/BLUE SHIELD | Admitting: Gynecology

## 2014-04-22 ENCOUNTER — Encounter: Payer: Self-pay | Admitting: Gynecology

## 2014-04-22 ENCOUNTER — Other Ambulatory Visit (HOSPITAL_COMMUNITY)
Admission: RE | Admit: 2014-04-22 | Discharge: 2014-04-22 | Disposition: A | Discharge status: Home / Self Care | Payer: BLUE CROSS/BLUE SHIELD | Source: Ambulatory Visit | Attending: Gynecology | Admitting: Gynecology

## 2014-04-22 VITALS — BP 130/84 | Ht 68.0 in | Wt 315.0 lb

## 2014-04-22 DIAGNOSIS — Z01419 Encounter for gynecological examination (general) (routine) without abnormal findings: Secondary | ICD-10-CM

## 2014-04-22 DIAGNOSIS — R87810 Cervical high risk human papillomavirus (HPV) DNA test positive: Secondary | ICD-10-CM | POA: Diagnosis not present

## 2014-04-22 DIAGNOSIS — Z1151 Encounter for screening for human papillomavirus (HPV): Secondary | ICD-10-CM | POA: Insufficient documentation

## 2014-04-22 DIAGNOSIS — Z3009 Encounter for other general counseling and advice on contraception: Secondary | ICD-10-CM

## 2014-04-22 LAB — CBC WITH DIFFERENTIAL/PLATELET
BASOS PCT: 0 % (ref 0–1)
Basophils Absolute: 0 10*3/uL (ref 0.0–0.1)
EOS PCT: 3 % (ref 0–5)
Eosinophils Absolute: 0.2 10*3/uL (ref 0.0–0.7)
HCT: 36.1 % (ref 36.0–46.0)
HEMOGLOBIN: 11.7 g/dL — AB (ref 12.0–15.0)
Lymphocytes Relative: 55 % — ABNORMAL HIGH (ref 12–46)
Lymphs Abs: 3.4 10*3/uL (ref 0.7–4.0)
MCH: 26.5 pg (ref 26.0–34.0)
MCHC: 32.4 g/dL (ref 30.0–36.0)
MCV: 81.7 fL (ref 78.0–100.0)
MPV: 9.4 fL (ref 8.6–12.4)
Monocytes Absolute: 0.4 10*3/uL (ref 0.1–1.0)
Monocytes Relative: 7 % (ref 3–12)
NEUTROS ABS: 2.2 10*3/uL (ref 1.7–7.7)
Neutrophils Relative %: 35 % — ABNORMAL LOW (ref 43–77)
Platelets: 343 10*3/uL (ref 150–400)
RBC: 4.42 MIL/uL (ref 3.87–5.11)
RDW: 14.8 % (ref 11.5–15.5)
WBC: 6.2 10*3/uL (ref 4.0–10.5)

## 2014-04-22 LAB — COMPREHENSIVE METABOLIC PANEL
ALBUMIN: 3.7 g/dL (ref 3.5–5.2)
ALT: 17 U/L (ref 0–35)
AST: 17 U/L (ref 0–37)
Alkaline Phosphatase: 84 U/L (ref 39–117)
BILIRUBIN TOTAL: 0.4 mg/dL (ref 0.2–1.2)
BUN: 11 mg/dL (ref 6–23)
CO2: 22 mEq/L (ref 19–32)
Calcium: 8.9 mg/dL (ref 8.4–10.5)
Chloride: 104 mEq/L (ref 96–112)
Creat: 0.72 mg/dL (ref 0.50–1.10)
Glucose, Bld: 84 mg/dL (ref 70–99)
POTASSIUM: 4.3 meq/L (ref 3.5–5.3)
Sodium: 137 mEq/L (ref 135–145)
TOTAL PROTEIN: 7.1 g/dL (ref 6.0–8.3)

## 2014-04-22 LAB — LIPID PANEL
CHOLESTEROL: 164 mg/dL (ref 0–200)
HDL: 56 mg/dL (ref >39)
LDL Cholesterol: 94 mg/dL (ref 0–99)
TRIGLYCERIDES: 68 mg/dL (ref <150)
Total CHOL/HDL Ratio: 2.9 Ratio
VLDL: 14 mg/dL (ref 0–40)

## 2014-04-22 LAB — HEMOGLOBIN A1C
HEMOGLOBIN A1C: 6.3 % — AB (ref <5.7)
Mean Plasma Glucose: 134 mg/dL — ABNORMAL HIGH (ref <117)

## 2014-04-22 LAB — TSH: TSH: 2.242 u[IU]/mL (ref 0.350–4.500)

## 2014-04-22 NOTE — Progress Notes (Signed)
Alexandra Morgan [birth year] [id number]        34 y.o.  G1P0010 for annual exam.  Has not been here for several years. Several issues discussed below.  Past medical history,surgical history, problem list, medications, allergies, family history and social history were all reviewed and documented as reviewed in the EPIC chart.  ROS:  Performed with pertinent positives and negatives included in the history, assessment and plan.   Additional significant findings :  none   Exam: Kim Counsellor Vitals:   04/22/14 1023  BP: 130/84  Height: 5\' 8"  (1.727 m)  Weight: 315 lb (142.883 kg)   General appearance:  Normal affect, orientation and appearance. Skin: Grossly normal HEENT: Without gross lesions.  No cervical or supraclavicular adenopathy. Thyroid normal.  Lungs:  Clear without wheezing, rales or rhonchi Cardiac: RR, without RMG Abdominal:  Soft, nontender, without masses, guarding, rebound, organomegaly or hernia Breasts:  Examined lying and sitting without masses, retractions, discharge or axillary adenopathy. Pelvic:  Ext/BUS/vagina normal  Cervix normal. Pap/HPV done  Uterus grossly normal size, midline and mobile nontender   Adnexa  Without masses or tenderness    Anus and perineum  Normal   Rectovaginal  Normal sphincter tone without palpated masses or tenderness.    Assessment/Plan:  34 y.o. G39P0010 female for annual exam with regular menses, inconsistent contraception.   1. Inconsistent contraception. Patient using condoms intermittently. Understands the failure risk. Options reviewed to include pill patch or ring Depo-Provera Nexplanon IUDs. The pros/cons, risks/benefits of these choice discussed. Increased risk of thrombosis with estrogen-containing products to include stroke heart attack DVT reviewed. She is a never smoker.  Risks of IUD to include perforation requiring surgery to remove or migration. The risks of infection leading to infertility also reviewed. Issues with  progesterone only to include possible weight gain/irregular bleeding reviewed.  Patient wants to think of her options and will follow up with her decision. She understands she is at risk for pregnancy if she continues as she is doing. 2. STD screening discussed and offered. Patient declined having been screened as past year at [facility] and feeling no new risk. 3. Pap smear 2011. Pap/HPV today. No history of significant abnormal Pap smears previously. 4. History of abdominal myomectomy. Follow up ultrasound October 2010 showed multiple small myomas. Her exam palpates grossly normal without significant uterine enlargement. Regular menses although somewhat heavy. Again discussed benefits of contraception as far as helping with heavier menses to include IUD and oral contraceptives. 5. Screening mammographic recommendations between 5 and 40 reviewed. No close relative with breast cancer. Patient prefers to wait closer to 40. SBE monthly reviewed. 6. Health maintenance. Baseline CBC comprehensive metabolic panel lipid profile urine analysis TSH hemoglobin A1c ordered. Follow up with contraceptive decision otherwise annually.     [name] MD, 10:54 AM 04/22/2014

## 2014-04-22 NOTE — Addendum Note (Signed)
Addended by: Nelva Nay on: 04/22/2014 11:08 AM   Modules accepted: Orders, SmartSet

## 2014-04-22 NOTE — Patient Instructions (Addendum)
Follow up with your contraceptive decision.  You may obtain a copy of any labs that were done today by logging onto MyChart as outlined in the instructions provided with your AVS (after visit summary). The office will not call with normal lab results but certainly if there are any significant abnormalities then we will contact you.   Health Maintenance, Female A healthy lifestyle and preventative care can promote health and wellness.  Maintain regular health, dental, and eye exams.  Eat a healthy diet. Foods like vegetables, fruits, whole grains, low-fat dairy products, and lean protein foods contain the nutrients you need without too many calories. Decrease your intake of foods high in solid fats, added sugars, and salt. Get information about a proper diet from your caregiver, if necessary.  Regular physical exercise is one of the most important things you can do for your health. Most adults should get at least 150 minutes of moderate-intensity exercise (any activity that increases your heart rate and causes you to sweat) each week. In addition, most adults need muscle-strengthening exercises on 2 or more days a week.   Maintain a healthy weight. The body mass index (BMI) is a screening tool to identify possible weight problems. It provides an estimate of body fat based on height and weight. Your caregiver can help determine your BMI, and can help you achieve or maintain a healthy weight. For adults 20 years and older:  A BMI below 18.5 is considered underweight.  A BMI of 18.5 to 24.9 is normal.  A BMI of 25 to 29.9 is considered overweight.  A BMI of 30 and above is considered obese.  Maintain normal blood lipids and cholesterol by exercising and minimizing your intake of saturated fat. Eat a balanced diet with plenty of fruits and vegetables. Blood tests for lipids and cholesterol should begin at age 20 and be repeated every 5 years. If your lipid or cholesterol levels are high, you are  over 50, or you are a high risk for heart disease, you may need your cholesterol levels checked more frequently.Ongoing high lipid and cholesterol levels should be treated with medicines if diet and exercise are not effective.  If you smoke, find out from your caregiver how to quit. If you do not use tobacco, do not start.  Lung cancer screening is recommended for adults aged 55 80 years who are at high risk for developing lung cancer because of a history of smoking. Yearly low-dose computed tomography (CT) is recommended for people who have at least a 30-pack-year history of smoking and are a current smoker or have quit within the past 15 years. A pack year of smoking is smoking an average of 1 pack of cigarettes a day for 1 year (for example: 1 pack a day for 30 years or 2 packs a day for 15 years). Yearly screening should continue until the smoker has stopped smoking for at least 15 years. Yearly screening should also be stopped for people who develop a health problem that would prevent them from having lung cancer treatment.  If you are pregnant, do not drink alcohol. If you are breastfeeding, be very cautious about drinking alcohol. If you are not pregnant and choose to drink alcohol, do not exceed 1 drink per day. One drink is considered to be 12 ounces (355 mL) of beer, 5 ounces (148 mL) of wine, or 1.5 ounces (44 mL) of liquor.  Avoid use of street drugs. Do not share needles with anyone. Ask for help if   you need support or instructions about stopping the use of drugs.  High blood pressure causes heart disease and increases the risk of stroke. Blood pressure should be checked at least every 1 to 2 years. Ongoing high blood pressure should be treated with medicines, if weight loss and exercise are not effective.  If you are 46 to 36 years old, ask your caregiver if you should take aspirin to prevent strokes.  Diabetes screening involves taking a blood sample to check your fasting blood sugar  level. This should be done once every 3 years, after age 65, if you are within normal weight and without risk factors for diabetes. Testing should be considered at a younger age or be carried out more frequently if you are overweight and have at least 1 risk factor for diabetes.  Breast cancer screening is essential preventative care for women. You should practice "breast self-awareness." This means understanding the normal appearance and feel of your breasts and may include breast self-examination. Any changes detected, no matter how small, should be reported to a caregiver. Women in their 33s and 30s should have a clinical breast exam (CBE) by a caregiver as part of a regular health exam every 1 to 3 years. After age 43, women should have a CBE every year. Starting at age 45, women should consider having a mammogram (breast X-ray) every year. Women who have a family history of breast cancer should talk to their caregiver about genetic screening. Women at a high risk of breast cancer should talk to their caregiver about having an MRI and a mammogram every year.  Breast cancer gene (BRCA)-related cancer risk assessment is recommended for women who have family members with BRCA-related cancers. BRCA-related cancers include breast, ovarian, tubal, and peritoneal cancers. Having family members with these cancers may be associated with an increased risk for harmful changes (mutations) in the breast cancer genes BRCA1 and BRCA2. Results of the assessment will determine the need for genetic counseling and BRCA1 and BRCA2 testing.  The Pap test is a screening test for cervical cancer. Women should have a Pap test starting at age 68. Between ages 59 and 86, Pap tests should be repeated every 2 years. Beginning at age 28, you should have a Pap test every 3 years as long as the past 3 Pap tests have been normal. If you had a hysterectomy for a problem that was not cancer or a condition that could lead to cancer, then  you no longer need Pap tests. If you are between ages 2 and 25, and you have had normal Pap tests going back 10 years, you no longer need Pap tests. If you have had past treatment for cervical cancer or a condition that could lead to cancer, you need Pap tests and screening for cancer for at least 20 years after your treatment. If Pap tests have been discontinued, risk factors (such as a new sexual partner) need to be reassessed to determine if screening should be resumed. Some women have medical problems that increase the chance of getting cervical cancer. In these cases, your caregiver may recommend more frequent screening and Pap tests.  The human papillomavirus (HPV) test is an additional test that may be used for cervical cancer screening. The HPV test looks for the virus that can cause the cell changes on the cervix. The cells collected during the Pap test can be tested for HPV. The HPV test could be used to screen women aged 20 years and older, and should  be used in women of any age who have unclear Pap test results. After the age of 17, women should have HPV testing at the same frequency as a Pap test.  Colorectal cancer can be detected and often prevented. Most routine colorectal cancer screening begins at the age of 43 and continues through age 15. However, your caregiver may recommend screening at an earlier age if you have risk factors for colon cancer. On a yearly basis, your caregiver may provide home test kits to check for hidden blood in the stool. Use of a small camera at the end of a tube, to directly examine the colon (sigmoidoscopy or colonoscopy), can detect the earliest forms of colorectal cancer. Talk to your caregiver about this at age 16, when routine screening begins. Direct examination of the colon should be repeated every 5 to 10 years through age 31, unless early forms of pre-cancerous polyps or small growths are found.  Hepatitis C blood testing is recommended for all people born  from [birth year] through 1967 and any individual with known risks for hepatitis C.  Practice safe sex. Use condoms and avoid high-risk sexual practices to reduce the spread of sexually transmitted infections (STIs). Sexually active women aged 11 and younger should be checked for Chlamydia, which is a common sexually transmitted infection. Older women with new or multiple partners should also be tested for Chlamydia. Testing for other STIs is recommended if you are sexually active and at increased risk.  Osteoporosis is a disease in which the bones lose minerals and strength with aging. This can result in serious bone fractures. The risk of osteoporosis can be identified using a bone density scan. Women ages 19 and over and women at risk for fractures or osteoporosis should discuss screening with their caregivers. Ask your caregiver whether you should be taking a calcium supplement or vitamin D to reduce the rate of osteoporosis.  Menopause can be associated with physical symptoms and risks. Hormone replacement therapy is available to decrease symptoms and risks. You should talk to your caregiver about whether hormone replacement therapy is right for you.  Use sunscreen. Apply sunscreen liberally and repeatedly throughout the day. You should seek shade when your shadow is shorter than you. Protect yourself by wearing long sleeves, pants, a wide-brimmed hat, and sunglasses year round, whenever you are outdoors.  Notify your caregiver of new moles or changes in moles, especially if there is a change in shape or color. Also notify your caregiver if a mole is larger than the size of a pencil eraser.  Stay current with your immunizations. Document Released: 10/05/2010 Document Revised: 07/17/2012 Document Reviewed: 10/05/2010 [facility] Patient Information 2014 [place].

## 2014-04-23 LAB — URINALYSIS W MICROSCOPIC + REFLEX CULTURE
Bilirubin Urine: NEGATIVE
Casts: NONE SEEN
Crystals: NONE SEEN
Glucose, UA: NEGATIVE mg/dL
Hgb urine dipstick: NEGATIVE
Ketones, ur: NEGATIVE mg/dL
Leukocytes, UA: NEGATIVE
Nitrite: NEGATIVE
Protein, ur: NEGATIVE mg/dL
Specific Gravity, Urine: 1.03 (ref 1.005–1.030)
Urobilinogen, UA: 0.2 mg/dL (ref 0.0–1.0)
pH: 5.5 (ref 5.0–8.0)

## 2014-04-24 LAB — URINE CULTURE: Colony Count: 25000

## 2014-05-06 ENCOUNTER — Telehealth: Payer: Self-pay

## 2014-05-06 DIAGNOSIS — A749 Chlamydial infection, unspecified: Secondary | ICD-10-CM

## 2014-05-06 HISTORY — DX: Chlamydial infection, unspecified: A74.9

## 2014-05-06 NOTE — Telephone Encounter (Signed)
I called Cytology on 05/02/14 because we still had not received the High Risk subtype for 16/18/45.  Stanton Kidney said there was an issue again with the result transferring into EPIC.  It is able to be viewed in her system. They are working on getting it into the system but in the meantime wanted you to know the Subtype for 16, 18, 45 is negative.

## 2014-05-07 ENCOUNTER — Encounter: Payer: Self-pay | Admitting: Gynecology

## 2014-05-07 NOTE — Telephone Encounter (Signed)
I spoke with patient and informed her of pap results and need to repeat in one year. Recall placed.

## 2014-05-07 NOTE — Telephone Encounter (Signed)
Tell patient that her Pap smear was normal as far as the weight the cells looked but did show the positive high risk HPV screen.  Recommend repeating the Pap smear with HPV screen and one year.

## 2014-05-09 LAB — CYTOLOGY - PAP

## 2014-06-03 ENCOUNTER — Ambulatory Visit (INDEPENDENT_AMBULATORY_CARE_PROVIDER_SITE_OTHER): Payer: BLUE CROSS/BLUE SHIELD | Admitting: Gynecology

## 2014-06-03 ENCOUNTER — Encounter: Payer: Self-pay | Admitting: Gynecology

## 2014-06-03 ENCOUNTER — Ambulatory Visit (INDEPENDENT_AMBULATORY_CARE_PROVIDER_SITE_OTHER): Payer: BLUE CROSS/BLUE SHIELD

## 2014-06-03 ENCOUNTER — Other Ambulatory Visit: Payer: Self-pay | Admitting: Gynecology

## 2014-06-03 VITALS — BP 130/80

## 2014-06-03 DIAGNOSIS — N852 Hypertrophy of uterus: Secondary | ICD-10-CM

## 2014-06-03 DIAGNOSIS — R102 Pelvic and perineal pain: Secondary | ICD-10-CM

## 2014-06-03 DIAGNOSIS — D252 Subserosal leiomyoma of uterus: Secondary | ICD-10-CM

## 2014-06-03 DIAGNOSIS — D259 Leiomyoma of uterus, unspecified: Secondary | ICD-10-CM | POA: Diagnosis not present

## 2014-06-03 DIAGNOSIS — N8300 Follicular cyst of ovary, unspecified side: Secondary | ICD-10-CM

## 2014-06-03 DIAGNOSIS — D251 Intramural leiomyoma of uterus: Secondary | ICD-10-CM

## 2014-06-03 LAB — CBC WITH DIFFERENTIAL/PLATELET
BASOS ABS: 0 10*3/uL (ref 0.0–0.1)
Basophils Relative: 0 % (ref 0–1)
Eosinophils Absolute: 0.2 10*3/uL (ref 0.0–0.7)
Eosinophils Relative: 4 % (ref 0–5)
HEMATOCRIT: 35.4 % — AB (ref 36.0–46.0)
Hemoglobin: 11.5 g/dL — ABNORMAL LOW (ref 12.0–15.0)
LYMPHS PCT: 43 % (ref 12–46)
Lymphs Abs: 2.7 10*3/uL (ref 0.7–4.0)
MCH: 26.6 pg (ref 26.0–34.0)
MCHC: 32.5 g/dL (ref 30.0–36.0)
MCV: 81.9 fL (ref 78.0–100.0)
MONOS PCT: 6 % (ref 3–12)
MPV: 9.5 fL (ref 8.6–12.4)
Monocytes Absolute: 0.4 10*3/uL (ref 0.1–1.0)
NEUTROS ABS: 2.9 10*3/uL (ref 1.7–7.7)
Neutrophils Relative %: 47 % (ref 43–77)
Platelets: 347 10*3/uL (ref 150–400)
RBC: 4.32 MIL/uL (ref 3.87–5.11)
RDW: 15 % (ref 11.5–15.5)
WBC: 6.2 10*3/uL (ref 4.0–10.5)

## 2014-06-03 LAB — URINALYSIS W MICROSCOPIC + REFLEX CULTURE
BILIRUBIN URINE: NEGATIVE
CASTS: NONE SEEN
CRYSTALS: NONE SEEN
Glucose, UA: NEGATIVE mg/dL
KETONES UR: NEGATIVE mg/dL
Leukocytes, UA: NEGATIVE
Nitrite: NEGATIVE
PH: 5.5 (ref 5.0–8.0)
Protein, ur: 30 mg/dL — AB
UROBILINOGEN UA: 0.2 mg/dL (ref 0.0–1.0)
WBC, UA: NONE SEEN WBC/hpf (ref <3)

## 2014-06-03 MED ORDER — IBUPROFEN 800 MG PO TABS: 800.0000 mg | ORAL_TABLET | Freq: Three times a day (TID) | ORAL | Status: DC | PRN

## 2014-06-03 NOTE — Patient Instructions (Signed)
Follow up for ultrasound as scheduled. Take the ibuprofen every 8 hours as needed for pain.

## 2014-06-03 NOTE — Addendum Note (Signed)
Addended by: Nelva Nay on: 06/03/2014 10:28 AM   Modules accepted: Orders

## 2014-06-03 NOTE — Progress Notes (Addendum)
Alexandra Morgan Jun 01, 1980 [id number]        34 y.o.  G1P0010 Presents with lower abdominal pain starting proximally 5 days ago. Comes and goes mostly in the left side. Some radiation to the back. No fever chills nausea vomiting diarrhea constipation. No frequency dysuria urgency. No vaginal discharge or odor.  Menses regular with LMP 05/19/2014. No bleeding in between.  History of small leiomyoma.  Past medical history,surgical history, problem list, medications, allergies, family history and social history were all reviewed and documented in the EPIC chart.  Directed ROS with pertinent positives and negatives documented in the history of present illness/assessment and plan.  Exam: Kim assistant General appearance:  Normal Spine straight without CVA tenderness. Abdomen soft nontender without masses guarding rebound Pelvic external BUS vagina normal. Cervix grossly normal. Uterus grossly normal with exam limited by abdominal girth. No tenderness on motion. Adnexa without gross masses or tenderness. Rectal exam normal.  Assessment/Plan:  34 y.o. G1P0010 with above history. Urinalysis is negative. Will follow up with culture. GC/Chlamydia screen done. Check baseline CBC hCG. Follow up for ultrasound to rule out nonpalpable abnormalities. Differential discussed to include GYN versus non-GYN. Ibuprofen 800 mg #30 one by mouth every 8 hours when necessary pain with 1 refill.  Addendum: Patient had her ultrasound today. Ultrasound shows uterus enlarged with multiple leiomyoma. Sampling shows largest 67 mm, 39 mm, 32 mm, 29 mm 2, 21 mm and 15 mm. Endometrial echo 17 mm. Right ovary normal. Left ovary with thick-walled echo-free cyst 30 mm x 28 x 21 mm with negative flow. Cul-de-sac negative.  Patient will take the Motrin as discussed above. Assuming her pain resolves and will follow. Did recommend follow up ultrasound in 6 months to relook at her leiomyoma for stability.  [name] MD, 9:50 AM  06/03/2014

## 2014-06-04 ENCOUNTER — Other Ambulatory Visit: Payer: Self-pay | Admitting: Gynecology

## 2014-06-04 ENCOUNTER — Encounter: Payer: Self-pay | Admitting: Gynecology

## 2014-06-04 DIAGNOSIS — D259 Leiomyoma of uterus, unspecified: Secondary | ICD-10-CM

## 2014-06-04 DIAGNOSIS — N852 Hypertrophy of uterus: Secondary | ICD-10-CM

## 2014-06-04 DIAGNOSIS — R102 Pelvic and perineal pain: Secondary | ICD-10-CM

## 2014-06-04 LAB — GC/CHLAMYDIA PROBE AMP
CT Probe RNA: POSITIVE — AB
GC PROBE AMP APTIMA: NEGATIVE

## 2014-06-04 LAB — HCG, SERUM, QUALITATIVE: Preg, Serum: NEGATIVE

## 2014-06-04 LAB — URINE CULTURE

## 2014-06-04 MED ORDER — AZITHROMYCIN 500 MG PO TABS: 1000.0000 mg | ORAL_TABLET | Freq: Once | ORAL | Status: DC

## 2014-08-14 ENCOUNTER — Ambulatory Visit: Payer: BLUE CROSS/BLUE SHIELD | Admitting: Gynecology

## 2014-12-09 ENCOUNTER — Encounter (HOSPITAL_COMMUNITY): Payer: Self-pay

## 2014-12-09 ENCOUNTER — Inpatient Hospital Stay (HOSPITAL_COMMUNITY)
Admission: AD | Admit: 2014-12-09 | Discharge: 2014-12-09 | Disposition: A | Discharge status: Home / Self Care | Payer: Commercial Managed Care - PPO | Source: Ambulatory Visit | Attending: Obstetrics & Gynecology | Admitting: Obstetrics & Gynecology

## 2014-12-09 DIAGNOSIS — N898 Other specified noninflammatory disorders of vagina: Secondary | ICD-10-CM | POA: Diagnosis present

## 2014-12-09 DIAGNOSIS — A5901 Trichomonal vulvovaginitis: Secondary | ICD-10-CM | POA: Insufficient documentation

## 2014-12-09 LAB — URINALYSIS, ROUTINE W REFLEX MICROSCOPIC
Bilirubin Urine: NEGATIVE
Glucose, UA: NEGATIVE mg/dL
Ketones, ur: 15 mg/dL — AB
Nitrite: NEGATIVE
PROTEIN: NEGATIVE mg/dL
Specific Gravity, Urine: 1.025 (ref 1.005–1.030)
UROBILINOGEN UA: 0.2 mg/dL (ref 0.0–1.0)
pH: 6.5 (ref 5.0–8.0)

## 2014-12-09 LAB — WET PREP, GENITAL
CLUE CELLS WET PREP: NONE SEEN
Yeast Wet Prep HPF POC: NONE SEEN

## 2014-12-09 LAB — URINE MICROSCOPIC-ADD ON

## 2014-12-09 LAB — POCT PREGNANCY, URINE: Preg Test, Ur: NEGATIVE

## 2014-12-09 MED ORDER — METRONIDAZOLE 500 MG PO TABS
2000.0000 mg | ORAL_TABLET | Freq: Once | ORAL | Status: AC
  Administered 2014-12-09: 2000 mg via ORAL
  Filled 2014-12-09: qty 4

## 2014-12-09 NOTE — MAU Note (Signed)
Having a d/c, more than usual with an odor; started after cycle ended on Friday.  Slight itching.

## 2014-12-09 NOTE — MAU Provider Note (Signed)
History     CSN: [id number]  Arrival date and time: 12/09/14 1842   First Provider Initiated Contact with Patient 12/09/14 1932      Chief Complaint  Patient presents with  . Vaginal Discharge   HPI  [name] is a 34 y.o. female presenting to MAU with vaginal discharge. She has a history of HPV diagnosed in 2016; negative subtypes 16, 18, 45. She lost her insurance and has not been back to her GYN office.  Her period ended on Friday,  And on Saturday she noticed and increase in vaginal discharge with an odor. The discharge is frothy and white. No new sexual partners; she has had the same sexual partner for >1 year. No vaginal bleeding. She has occasional vaginal itching; the itching is inside of the vagina, not on the outside.   OB History    Gravida Para Term Preterm AB TAB SAB Ectopic Multiple Living   1    1 1     0      Past Medical History  Diagnosis Date  . Asthma   . Fibroid   . Migraines   . HPV test positive 04/2014    normal cytology, negative subtype 16, 18/45. Recommend repeat Pap smear/HPV one year  . Chlamydia 05/2014    Past Surgical History  Procedure Laterality Date  . Incision and drainage breast abscess    . Abdominal surgery  05/10    ABDOMINAL MYOMECTOMY Dr Elby Showers  . Dental surgery      Family History  Problem Relation Age of Onset  . Heart disease Father   . Hypertension Father   . Diabetes Father   . Hypertension Sister   . Diabetes Brother     Social History  Substance Use Topics  . Smoking status: Never Smoker   . Smokeless tobacco: None  . Alcohol Use: 0.0 oz/week    0 Standard drinks or equivalent per week     Comment: Rare    Allergies: No Known Allergies  Prescriptions prior to admission  Medication Sig Dispense Refill Last Dose  . albuterol (PROVENTIL HFA;VENTOLIN HFA) 108 (90 BASE) MCG/ACT inhaler Inhale 2 puffs into the lungs every 4 (four) hours as needed for wheezing or shortness of breath.   Taking  .  azithromycin (ZITHROMAX) 500 MG tablet Take 2 tablets (1,000 mg total) by mouth once. 2 tablet 0   . fluticasone (FLOVENT HFA) 110 MCG/ACT inhaler Inhale into the lungs 2 (two) times daily.   Taking  . folic acid (FOLVITE) 086 MCG tablet Take 400 mcg by mouth daily.   Taking  . ibuprofen (ADVIL,MOTRIN) 800 MG tablet Take 1 tablet (800 mg total) by mouth every 8 (eight) hours as needed. 30 tablet 1   . Lactobacillus (ACIDOPHILUS PO) Take 1 tablet by mouth daily.   Taking  . loratadine-pseudoephedrine (CLARITIN-D 24-HOUR) 10-240 MG per 24 hr tablet Take 1 tablet by mouth daily as needed for allergies.   Taking   Results for orders placed or performed during the hospital encounter of 12/09/14 (from the past 48 hour(s))  Urinalysis, Routine w reflex microscopic (not at [facility])     Status: Abnormal   Collection Time: 12/09/14  6:55 PM  Result Value Ref Range   Color, Urine YELLOW YELLOW   APPearance HAZY (A) CLEAR   Specific Gravity, Urine 1.025 1.005 - 1.030   pH 6.5 5.0 - 8.0   Glucose, UA NEGATIVE NEGATIVE mg/dL   Hgb urine dipstick TRACE (A) NEGATIVE  Bilirubin Urine NEGATIVE NEGATIVE   Ketones, ur 15 (A) NEGATIVE mg/dL   Protein, ur NEGATIVE NEGATIVE mg/dL   Urobilinogen, UA 0.2 0.0 - 1.0 mg/dL   Nitrite NEGATIVE NEGATIVE   Leukocytes, UA MODERATE (A) NEGATIVE  Urine microscopic-add on     Status: Abnormal   Collection Time: 12/09/14  6:55 PM  Result Value Ref Range   Squamous Epithelial / LPF FEW (A) RARE   WBC, UA 3-6 <3 WBC/hpf   RBC / HPF 0-2 <3 RBC/hpf   Bacteria, UA RARE RARE   Urine-Other TRICHOMONAS PRESENT   Pregnancy, urine POC     Status: None   Collection Time: 12/09/14  7:10 PM  Result Value Ref Range   Preg Test, Ur NEGATIVE NEGATIVE    Comment:        THE SENSITIVITY OF THIS METHODOLOGY IS >24 mIU/mL   Wet prep, genital     Status: Abnormal   Collection Time: 12/09/14  7:45 PM  Result Value Ref Range   Yeast Wet Prep HPF POC NONE SEEN NONE SEEN   Trich, Wet  Prep MODERATE (A) NONE SEEN   Clue Cells Wet Prep HPF POC NONE SEEN NONE SEEN   WBC, Wet Prep HPF POC MODERATE (A) NONE SEEN    Comment: MODERATE BACTERIA SEEN    Review of Systems  Constitutional: Negative for fever and chills.  Gastrointestinal: Negative for nausea, vomiting and abdominal pain.  Genitourinary: Negative for dysuria and urgency.   Physical Exam   Blood pressure 143/92, pulse 104, temperature 98.6 F (37 C), temperature source Oral, resp. rate 18, last menstrual period 11/30/2014.  Physical Exam  Constitutional: She appears well-developed and well-nourished. No distress.  Genitourinary:  Speculum exam: Vagina - Small amount of creamy discharge, mild odor. Many small, pink/flesh colored genital warts noted  Cervix - Patient did not tolerate speculum exam enough for me to visualize the cervix.  Bimanual exam: Cervix closed, no CMT, lesions palpated  Uterus non tender, normal size Adnexa non tender, no masses bilaterally GC/Chlam, wet prep done Chaperone present for exam.  Skin: She is not diaphoretic.    MAU Course  Procedures  None  MDM  Flagyl 2 grams given in MAU.  GC- Pending    Assessment and Plan   A:  1. Trichomonas vaginitis   2. Vaginal lesion likely condyloma     P:  Discharge home in stable condition Contact GYN Dr. Or call the [facility] for an appointment Partner needs treatment, no intercourse for 7 days Itching is likely due to the condyloma  Return to MAU if symptoms worsen   Lezlie Lye, NP 12/09/2014 7:37 PM

## 2014-12-10 LAB — GC/CHLAMYDIA PROBE AMP (~~LOC~~) NOT AT ARMC
CHLAMYDIA, DNA PROBE: NEGATIVE
Neisseria Gonorrhea: NEGATIVE

## 2014-12-27 ENCOUNTER — Ambulatory Visit (INDEPENDENT_AMBULATORY_CARE_PROVIDER_SITE_OTHER): Payer: Commercial Managed Care - PPO | Admitting: Gynecology

## 2014-12-27 ENCOUNTER — Encounter: Payer: Self-pay | Admitting: Gynecology

## 2014-12-27 VITALS — BP 124/80

## 2014-12-27 DIAGNOSIS — D251 Intramural leiomyoma of uterus: Secondary | ICD-10-CM

## 2014-12-27 DIAGNOSIS — A599 Trichomoniasis, unspecified: Secondary | ICD-10-CM | POA: Diagnosis not present

## 2014-12-27 DIAGNOSIS — N898 Other specified noninflammatory disorders of vagina: Secondary | ICD-10-CM | POA: Diagnosis not present

## 2014-12-27 DIAGNOSIS — L918 Other hypertrophic disorders of the skin: Secondary | ICD-10-CM

## 2014-12-27 DIAGNOSIS — N8320 Unspecified ovarian cysts: Secondary | ICD-10-CM

## 2014-12-27 DIAGNOSIS — N83202 Unspecified ovarian cyst, left side: Secondary | ICD-10-CM

## 2014-12-27 LAB — WET PREP FOR TRICH, YEAST, CLUE
CLUE CELLS WET PREP: NONE SEEN
TRICH WET PREP: NONE SEEN
YEAST WET PREP: NONE SEEN

## 2014-12-27 NOTE — Progress Notes (Signed)
Alexandra Morgan 03/17/1981 [id number]        34 y.o.  G1P0010 presents in follow up from a recent emergency room visit where she was diagnosed with trichomonas and treated. There was a question of cervical lesions that were palpated. The cervix was not visualized at the time of exam. Patients without complaints and notes that her discharge and symptoms from before are resolved.  Patient also had questions about follow up ultrasound from her ultrasound that was done in February that showed leiomyoma and a left ovarian 30 mm cyst. Was to repeated at 6 month interval. Lastly patient had questions about a mole under her left arm that is bothersome to her.  Past medical history,surgical history, problem list, medications, allergies, family history and social history were all reviewed and documented in the EPIC chart.  Directed ROS with pertinent positives and negatives documented in the history of present illness/assessment and plan.  Exam: Kim assistant Filed Vitals:   12/27/14 1131  BP: 124/80   General appearance:  Normal Left axilla with small pigmented mole. Pelvic external BUS vagina with white discharge. Visualization and palpation along the entire vaginal mucosa without abnormalities.  Cervix normal in appearance. Uterus unable to palpate due to abdominal girth. Adnexa without gross masses or tenderness.  Procedure: The skin surrounding the mole was cleansed with Betadine, infiltrated with 1% lidocaine and the mole was excised in its entirety at the level of the surrounding skin. Silver nitrate hemostasis applied.  Assessment/Plan:  34 y.o. G1P0010 with:  1. Recent Trichomonas treatment. GC chlamydia screen at that time was negative. Wet prep today is normal. Follow up if symptoms recur. 2. Ultrasound showing multiple leiomyomas the largest 6 cm. 30 mm left ovarian cyst. Follow up ultrasound now and patient will schedule follow up for this. 3. Pigmented mole left axilla. Patient wants removed  as it makes shaving difficult.  The mole was excised as above and sent to pathology. Patient will follow up for results. Patient is due for her annual in January and I reminded her to schedule this.    [name] MD, 11:58 AM 12/27/2014

## 2014-12-27 NOTE — Patient Instructions (Addendum)
Office will call you with biopsy results.  Follow-up for ultrasound as scheduled. 

## 2014-12-27 NOTE — Addendum Note (Signed)
Addended by: Nelva Nay on: 12/27/2014 02:05 PM   Modules accepted: Orders

## 2015-01-22 ENCOUNTER — Ambulatory Visit (INDEPENDENT_AMBULATORY_CARE_PROVIDER_SITE_OTHER): Payer: Commercial Managed Care - PPO

## 2015-01-22 ENCOUNTER — Ambulatory Visit (INDEPENDENT_AMBULATORY_CARE_PROVIDER_SITE_OTHER): Payer: Commercial Managed Care - PPO | Admitting: Gynecology

## 2015-01-22 ENCOUNTER — Encounter: Payer: Self-pay | Admitting: Gynecology

## 2015-01-22 ENCOUNTER — Other Ambulatory Visit: Payer: Self-pay | Admitting: Gynecology

## 2015-01-22 VITALS — BP 130/84

## 2015-01-22 DIAGNOSIS — N83201 Unspecified ovarian cyst, right side: Secondary | ICD-10-CM | POA: Diagnosis not present

## 2015-01-22 DIAGNOSIS — N852 Hypertrophy of uterus: Secondary | ICD-10-CM

## 2015-01-22 DIAGNOSIS — D251 Intramural leiomyoma of uterus: Secondary | ICD-10-CM

## 2015-01-22 DIAGNOSIS — N83202 Unspecified ovarian cyst, left side: Secondary | ICD-10-CM | POA: Diagnosis not present

## 2015-01-22 NOTE — Patient Instructions (Signed)
Follow up in January 2017 for annual exam when due. Sooner if any issues

## 2015-01-22 NOTE — Progress Notes (Signed)
Alexandra Morgan [birth year] [id number]        34 y.o.  G1P0010 Presents for follow up ultrasound. History of left ovarian cyst 30 mm noted February 2016. Doing well.  Past medical history,surgical history, problem list, medications, allergies, family history and social history were all reviewed and documented in the EPIC chart.  Directed ROS with pertinent positives and negatives documented in the history of present illness/assessment and plan.  Exam: Filed Vitals:   01/22/15 0914  BP: 130/84   General appearance:  Normal  Ultrasound shows uterus enlarged with multiple myomas measuring 85 mm, 65 mm, 60 mm, 58 mm, 48 mm endometrial echo 14 mm noting patient due for her menses now. Right ovary normal. Left ovary not visualized. Prior cystic change not visualized. Cul-de-sac with 61 x 16 mm fluid  Assessment/Plan:  34 y.o. G1P0010 with:  1. History of prior left ovarian cyst and pain. Now doing well without pain and apparent resolution of the cyst. Left ovary was not visualized but no pathology seen in the left adnexa. At this point recommend expectant management as pain has resolved and apparent resolution of the left ovarian cyst.  Follow up if pain develops or other issues. 2. Leiomyoma. Multiple large leiomyoma. History of prior open myomectomy 2010 by Dr. Elby Showers. Menses are regular and acceptable at this point. Options for management reviewed to include expectant, uterine artery embolization, multiple myomectomy either open/robotic hysterectomy. The issues to include future childbearing desires and risks obese choice discussed. At this point the patient prefers just observation as her menses are regular acceptable. Would recommend follow up with Dr Kirk Ruths office if she wants to pursue surgical intervention to see if they would entertain robotic surgery. Patient will follow up in January 2017 when she is due for her annual exam, sooner if any issues.   [name] MD, 9:33 AM  01/22/2015

## 2015-03-15 ENCOUNTER — Ambulatory Visit (INDEPENDENT_AMBULATORY_CARE_PROVIDER_SITE_OTHER): Payer: Commercial Managed Care - PPO | Admitting: Physician Assistant

## 2015-03-15 VITALS — BP 124/82 | HR 110 | Temp 98.9°F | Resp 14 | Ht 68.0 in | Wt 326.0 lb

## 2015-03-15 DIAGNOSIS — T3695XA Adverse effect of unspecified systemic antibiotic, initial encounter: Secondary | ICD-10-CM

## 2015-03-15 DIAGNOSIS — R062 Wheezing: Secondary | ICD-10-CM

## 2015-03-15 DIAGNOSIS — J45901 Unspecified asthma with (acute) exacerbation: Secondary | ICD-10-CM | POA: Diagnosis not present

## 2015-03-15 DIAGNOSIS — B379 Candidiasis, unspecified: Secondary | ICD-10-CM

## 2015-03-15 MED ORDER — IPRATROPIUM BROMIDE 0.02 % IN SOLN
0.5000 mg | Freq: Once | RESPIRATORY_TRACT | Status: AC
  Administered 2015-03-15: 0.5 mg via RESPIRATORY_TRACT

## 2015-03-15 MED ORDER — GUAIFENESIN ER 1200 MG PO TB12: 1.0000 | ORAL_TABLET | Freq: Two times a day (BID) | ORAL | Status: DC | PRN

## 2015-03-15 MED ORDER — HYDROCOD POLST-CPM POLST ER 10-8 MG/5ML PO SUER: 5.0000 mL | Freq: Every evening | ORAL | Status: AC | PRN

## 2015-03-15 MED ORDER — AZITHROMYCIN 250 MG PO TABS: ORAL_TABLET | ORAL | Status: DC

## 2015-03-15 MED ORDER — LORATADINE 10 MG PO TABS: 10.0000 mg | ORAL_TABLET | Freq: Every day | ORAL | Status: DC

## 2015-03-15 MED ORDER — FLUCONAZOLE 150 MG PO TABS: 150.0000 mg | ORAL_TABLET | Freq: Once | ORAL | Status: DC

## 2015-03-15 MED ORDER — ALBUTEROL SULFATE (2.5 MG/3ML) 0.083% IN NEBU
2.5000 mg | INHALATION_SOLUTION | Freq: Once | RESPIRATORY_TRACT | Status: AC
  Administered 2015-03-15: 2.5 mg via RESPIRATORY_TRACT

## 2015-03-15 NOTE — Progress Notes (Signed)
[facility] [address] [place] [address] [phone]  Date:  03/15/2015   Name:  Jesika Harvie   DOB:  1981/04/05   MRN:  [id number]  PCP:  No primary care provider on file.   Chief Complaint  Patient presents with  . Nasal Congestion    x 3 days  . Cough  . Shortness of Breath     History of Present Illness:  Niley Assenmacher is a 34 y.o. female patient who presents to [facility] for cc of productive cough and nasal congestion for 3 days. some productivity, green, and clear nasal mucus. -No ear pain or pressure -some nasal congestion -no fever -she has used her asthma inhaler, but did not sleep well last night, secondary to sob and cough.   -head pain along the occipital -she has a mildew issue in the house.  She has noticed a decline in her health since this mildew emerged. She has been taking the Flovent and albuterol, Advil cold and sinus, which has helped some.  She works with long term care facility.  She has been taking the Flovent off and on.  Rarely uses the albuterol.  -long term care.    There are no active problems to display for this patient.   Past Medical History  Diagnosis Date  . Asthma   . Fibroid   . Migraines   . HPV test positive 04/2014    normal cytology, negative subtype 16, 18/45. Recommend repeat Pap smear/HPV one year  . Chlamydia 05/2014    Past Surgical History  Procedure Laterality Date  . Incision and drainage breast abscess    . Abdominal surgery  05/10    ABDOMINAL MYOMECTOMY Dr Elby Showers  . Dental surgery      Social History  Substance Use Topics  . Smoking status: Never Smoker   . Smokeless tobacco: None  . Alcohol Use: 0.0 oz/week    0 Standard drinks or equivalent per week     Comment: Rare    Family History  Problem Relation Age of Onset  . Heart disease Father   . Hypertension Father   . Diabetes Father   . Hypertension Sister   . Diabetes Brother     No Known Allergies  Medication list has  been reviewed and updated.  Current Outpatient Prescriptions on File Prior to Visit  Medication Sig Dispense Refill  . albuterol (PROVENTIL HFA;VENTOLIN HFA) 108 (90 BASE) MCG/ACT inhaler Inhale 2 puffs into the lungs every 4 (four) hours as needed for wheezing or shortness of breath.    . fluticasone (FLOVENT HFA) 110 MCG/ACT inhaler Inhale into the lungs 2 (two) times daily.    . folic acid (FOLVITE) A999333 MCG tablet Take 400 mcg by mouth daily.    Marland Kitchen ibuprofen (ADVIL,MOTRIN) 800 MG tablet Take 1 tablet (800 mg total) by mouth every 8 (eight) hours as needed. 30 tablet 1  . Lactobacillus (ACIDOPHILUS PO) Take 1 tablet by mouth daily.    . Multiple Vitamins-Minerals (MULTIVITAMIN WITH MINERALS) tablet Take 1 tablet by mouth daily.    . vitamin C (ASCORBIC ACID) 500 MG tablet Take 1,000 mg by mouth daily.     No current facility-administered medications on file prior to visit.    ROS ROS otherwise unremarkable unless listed above.   Physical Examination: BP 124/82 mmHg  Pulse 110  Temp(Src) 98.9 F (37.2 C) (Oral)  Resp 14  Ht 5\' 8"  (1.727 m)  Wt 326 lb (147.873 kg)  BMI 49.58 kg/m2  SpO2 94% Ideal Body Weight: Weight in (lb) to have BMI = 25: 164.1  Physical Exam  Constitutional: She is oriented to person, place, and time. She appears well-developed and well-nourished. No distress.  HENT:  Head: Normocephalic and atraumatic.  Right Ear: External ear normal.  Left Ear: External ear normal.  Eyes: Conjunctivae and EOM are normal. Pupils are equal, round, and reactive to light.  Cardiovascular: Normal rate and regular rhythm.  Exam reveals no gallop and no friction rub.   No murmur heard. Pulmonary/Chest: Effort normal. No respiratory distress. She has wheezes.  Neurological: She is alert and oriented to person, place, and time.  Skin: Skin is warm and dry. She is not diaphoretic.  Psychiatric: She has a normal mood and affect. Her behavior is normal.   Pre nebulizing tx:  280 Post nebulizing treatment 360 Wheezing dissipated post this duo neb.  Assessment and Plan: Mallie Barish is a 34 y.o. female who is here today for cc of sob, nasal congestion, and cough.  Allergies likely major part of these symptoms.  I am going to start her on Claritin daily.  Advised to start using her Flovent daily.  She will use the albuterol for the next 24 hr q4hs.  This appeared to be very helpful for her breathing.  Will treat with abx at this time. Given diflucan.  Encouraged her to start making plans to move from this trigger.  Patient voiced understanding.    Asthmatic bronchitis with exacerbation, unspecified asthma severity - Plan: azithromycin (ZITHROMAX) 250 MG tablet, Guaifenesin (MUCINEX MAXIMUM STRENGTH) 1200 MG TB12, loratadine (CLARITIN) 10 MG tablet, chlorpheniramine-HYDROcodone (TUSSIONEX PENNKINETIC ER) 10-8 MG/5ML SUER  Wheezing - Plan: albuterol (PROVENTIL) (2.5 MG/3ML) 0.083% nebulizer solution 2.5 mg, ipratropium (ATROVENT) nebulizer solution 0.5 mg  Antibiotic-induced yeast infection - Plan: fluconazole (DIFLUCAN) 150 MG tablet  Ivar Drape, PA-C [facility] 03/15/2015 9:39 AM

## 2015-03-15 NOTE — Patient Instructions (Signed)
Please take the flovent daily. Use the albuterol if you are having shortness of breath. Take the azithromycin as prescribed.   I would like you to start taking the claritin daily.   Acute Bronchitis Bronchitis is inflammation of the airways that extend from the windpipe into the lungs (bronchi). The inflammation often causes mucus to develop. This leads to a cough, which is the most common symptom of bronchitis.  In acute bronchitis, the condition usually develops suddenly and goes away over time, usually in a couple weeks. Smoking, allergies, and asthma can make bronchitis worse. Repeated episodes of bronchitis may cause further lung problems.  CAUSES Acute bronchitis is most often caused by the same virus that causes a cold. The virus can spread from person to person (contagious) through coughing, sneezing, and touching contaminated objects. SIGNS AND SYMPTOMS   Cough.   Fever.   Coughing up mucus.   Body aches.   Chest congestion.   Chills.   Shortness of breath.   Sore throat.  DIAGNOSIS  Acute bronchitis is usually diagnosed through a physical exam. Your health care provider will also ask you questions about your medical history. Tests, such as chest X-rays, are sometimes done to rule out other conditions.  TREATMENT  Acute bronchitis usually goes away in a couple weeks. Oftentimes, no medical treatment is necessary. Medicines are sometimes given for relief of fever or cough. Antibiotic medicines are usually not needed but may be prescribed in certain situations. In some cases, an inhaler may be recommended to help reduce shortness of breath and control the cough. A cool mist vaporizer may also be used to help thin bronchial secretions and make it easier to clear the chest.  HOME CARE INSTRUCTIONS  Get plenty of rest.   Drink enough fluids to keep your urine clear or pale yellow (unless you have a medical condition that requires fluid restriction). Increasing fluids  may help thin your respiratory secretions (sputum) and reduce chest congestion, and it will prevent dehydration.   Take medicines only as directed by your health care provider.  If you were prescribed an antibiotic medicine, finish it all even if you start to feel better.  Avoid smoking and secondhand smoke. Exposure to cigarette smoke or irritating chemicals will make bronchitis worse. If you are a smoker, consider using nicotine gum or skin patches to help control withdrawal symptoms. Quitting smoking will help your lungs heal faster.   Reduce the chances of another bout of acute bronchitis by washing your hands frequently, avoiding people with cold symptoms, and trying not to touch your hands to your mouth, nose, or eyes.   Keep all follow-up visits as directed by your health care provider.  SEEK MEDICAL CARE IF: Your symptoms do not improve after 1 week of treatment.  SEEK IMMEDIATE MEDICAL CARE IF:  You develop an increased fever or chills.   You have chest pain.   You have severe shortness of breath.  You have bloody sputum.   You develop dehydration.  You faint or repeatedly feel like you are going to pass out.  You develop repeated vomiting.  You develop a severe headache. MAKE SURE YOU:   Understand these instructions.  Will watch your condition.  Will get help right away if you are not doing well or get worse.   This information is not intended to replace advice given to you by your health care provider. Make sure you discuss any questions you have with your health care provider.   Document  Released: 04/29/2004 Document Revised: 04/12/2014 Document Reviewed: 09/12/2012 Elsevier Interactive Patient Education [employer].

## 2015-04-24 ENCOUNTER — Ambulatory Visit (INDEPENDENT_AMBULATORY_CARE_PROVIDER_SITE_OTHER): Payer: Commercial Managed Care - PPO | Admitting: Gynecology

## 2015-04-24 ENCOUNTER — Other Ambulatory Visit (HOSPITAL_COMMUNITY)
Admission: RE | Admit: 2015-04-24 | Discharge: 2015-04-24 | Disposition: A | Discharge status: Home / Self Care | Payer: Commercial Managed Care - PPO | Source: Ambulatory Visit | Attending: Gynecology | Admitting: Gynecology

## 2015-04-24 ENCOUNTER — Encounter: Payer: Self-pay | Admitting: Gynecology

## 2015-04-24 VITALS — BP 120/80 | Ht 68.0 in | Wt 324.0 lb

## 2015-04-24 DIAGNOSIS — Z1151 Encounter for screening for human papillomavirus (HPV): Secondary | ICD-10-CM | POA: Insufficient documentation

## 2015-04-24 DIAGNOSIS — Z01419 Encounter for gynecological examination (general) (routine) without abnormal findings: Secondary | ICD-10-CM | POA: Diagnosis not present

## 2015-04-24 DIAGNOSIS — Z113 Encounter for screening for infections with a predominantly sexual mode of transmission: Secondary | ICD-10-CM

## 2015-04-24 DIAGNOSIS — R87810 Cervical high risk human papillomavirus (HPV) DNA test positive: Secondary | ICD-10-CM | POA: Diagnosis not present

## 2015-04-24 DIAGNOSIS — Z13220 Encounter for screening for lipoid disorders: Secondary | ICD-10-CM

## 2015-04-24 DIAGNOSIS — N898 Other specified noninflammatory disorders of vagina: Secondary | ICD-10-CM

## 2015-04-24 LAB — CBC WITH DIFFERENTIAL/PLATELET
Basophils Absolute: 0 10*3/uL (ref 0.0–0.1)
Basophils Relative: 0 % (ref 0–1)
EOS ABS: 0.1 10*3/uL (ref 0.0–0.7)
EOS PCT: 1 % (ref 0–5)
HCT: 29.7 % — ABNORMAL LOW (ref 36.0–46.0)
Hemoglobin: 9.2 g/dL — ABNORMAL LOW (ref 12.0–15.0)
LYMPHS ABS: 2.5 10*3/uL (ref 0.7–4.0)
Lymphocytes Relative: 41 % (ref 12–46)
MCH: 23.5 pg — AB (ref 26.0–34.0)
MCHC: 31 g/dL (ref 30.0–36.0)
MCV: 76 fL — AB (ref 78.0–100.0)
MONOS PCT: 6 % (ref 3–12)
MPV: 8.9 fL (ref 8.6–12.4)
Monocytes Absolute: 0.4 10*3/uL (ref 0.1–1.0)
Neutro Abs: 3.2 10*3/uL (ref 1.7–7.7)
Neutrophils Relative %: 52 % (ref 43–77)
PLATELETS: 390 10*3/uL (ref 150–400)
RBC: 3.91 MIL/uL (ref 3.87–5.11)
RDW: 17.7 % — AB (ref 11.5–15.5)
WBC: 6.1 10*3/uL (ref 4.0–10.5)

## 2015-04-24 LAB — LIPID PANEL
CHOL/HDL RATIO: 3.3 ratio (ref <=5.0)
CHOLESTEROL: 169 mg/dL (ref 125–200)
HDL: 52 mg/dL (ref >=46)
LDL Cholesterol: 103 mg/dL (ref <130)
Triglycerides: 68 mg/dL (ref <150)
VLDL: 14 mg/dL (ref <30)

## 2015-04-24 LAB — WET PREP FOR TRICH, YEAST, CLUE
TRICH WET PREP: NONE SEEN
Yeast Wet Prep HPF POC: NONE SEEN

## 2015-04-24 LAB — COMPREHENSIVE METABOLIC PANEL
ALT: 16 U/L (ref 6–29)
AST: 15 U/L (ref 10–30)
Albumin: 3.6 g/dL (ref 3.6–5.1)
Alkaline Phosphatase: 75 U/L (ref 33–115)
BUN: 8 mg/dL (ref 7–25)
CALCIUM: 9 mg/dL (ref 8.6–10.2)
CHLORIDE: 108 mmol/L (ref 98–110)
CO2: 22 mmol/L (ref 20–31)
Creat: 0.63 mg/dL (ref 0.50–1.10)
Glucose, Bld: 86 mg/dL (ref 65–99)
POTASSIUM: 4.1 mmol/L (ref 3.5–5.3)
Sodium: 139 mmol/L (ref 135–146)
TOTAL PROTEIN: 6.8 g/dL (ref 6.1–8.1)
Total Bilirubin: 0.4 mg/dL (ref 0.2–1.2)

## 2015-04-24 MED ORDER — METRONIDAZOLE 500 MG PO TABS: 500.0000 mg | ORAL_TABLET | Freq: Two times a day (BID) | ORAL | Status: DC

## 2015-04-24 NOTE — Progress Notes (Signed)
Alexandra Morgan 1981/03/06 [id number]        34 y.o.  G1P0010  for annual exam.  Several issues noted below.  Past medical history,surgical history, problem list, medications, allergies, family history and social history were all reviewed and documented as reviewed in the EPIC chart.  ROS:  Performed with pertinent positives and negatives included in the history, assessment and plan.   Additional significant findings :  none   Exam: [name] assistant Filed Vitals:   04/24/15 0857  BP: 120/80  Height: 5\' 8"  (1.727 m)  Weight: 324 lb (146.965 kg)   General appearance:  Normal affect, orientation and appearance. Skin: Grossly normal HEENT: Without gross lesions.  No cervical or supraclavicular adenopathy. Thyroid normal.  Lungs:  Clear without wheezing, rales or rhonchi Cardiac: RR, without RMG Abdominal:  Soft, nontender, without masses, guarding, rebound, organomegaly or hernia Breasts:  Examined lying and sitting without masses, retractions, discharge or axillary adenopathy. Pelvic:  Ext/BUS/vagina normal excepting slight white discharge.  Cervix normal. Pap smear/HPV, GC/chlamydia  Uterus difficult to palpate but no gross masses or tenderness  Adnexa  Without gross masses or tenderness    Anus and perineum  Normal   Rectovaginal  Normal sphincter tone without palpated masses or tenderness.    Assessment/Plan:  35 y.o. G35P0010 female for annual exam with regular menses, condom contraception.   1. Contraception. Patient using condoms although currently is not sexually active.  Risk of failure and availability of plan B discussed. Alternatives to include IUD also reviewed. Possible benefit as far as her periods from a menstrual suppression standpoint also discussed. At this point patient not interested but will follow up if she changes her mind. 2. History of leiomyoma. Uterus difficult to palpate but not grossly enlarged. Recent ultrasound shows uterus with multiple myomas.  Having regular menses. Plan observation at this point. History of prior abdominal myomectomy. 3. Pap smear 2016 normal cytology but positive high risk HPV.'s negative subtype 16, 18/45. Pap smear with HPV done today. 4. History of chlamydia 05/2014. GC/Chlamydia screen done today. 5. Mammography baseline screening recommendations between 35 and 40 reviewed.  No strong family history of breast cancer. Plan Baseline closer to 40. 6. Health maintenance. Patient requests screening lab work. CBC, comprehensive metabolic panel, lipid profile, urinalysis ordered.  Follow up in one year, sooner as needed.   [name] MD, 9:45 AM 04/24/2015

## 2015-04-24 NOTE — Addendum Note (Signed)
Addended by: Nelva Nay on: 04/24/2015 09:59 AM   Modules accepted: Orders

## 2015-04-24 NOTE — Patient Instructions (Signed)
Take the Flagyl medication twice daily for 7 days. Avoid alcohol while taking.  You may obtain a copy of any labs that were done today by logging onto MyChart as outlined in the instructions provided with your AVS (after visit summary). The office will not call with normal lab results but certainly if there are any significant abnormalities then we will contact you.   Health Maintenance Adopting a healthy lifestyle and getting preventive care can go a long way to promote health and wellness. Talk with your health care provider about what schedule of regular examinations is right for you. This is a good chance for you to check in with your provider about disease prevention and staying healthy. In between checkups, there are plenty of things you can do on your own. Experts have done a lot of research about which lifestyle changes and preventive measures are most likely to keep you healthy. Ask your health care provider for more information. WEIGHT AND DIET  Eat a healthy diet  Be sure to include plenty of vegetables, fruits, low-fat dairy products, and lean protein.  Do not eat a lot of foods high in solid fats, added sugars, or salt.  Get regular exercise. This is one of the most important things you can do for your health.  Most adults should exercise for at least 150 minutes each week. The exercise should increase your heart rate and make you sweat (moderate-intensity exercise).  Most adults should also do strengthening exercises at least twice a week. This is in addition to the moderate-intensity exercise.  Maintain a healthy weight  Body mass index (BMI) is a measurement that can be used to identify possible weight problems. It estimates body fat based on height and weight. Your health care provider can help determine your BMI and help you achieve or maintain a healthy weight.  For females 69 years of age and older:   A BMI below 18.5 is considered underweight.  A BMI of 18.5 to 24.9  is normal.  A BMI of 25 to 29.9 is considered overweight.  A BMI of 30 and above is considered obese.  Watch levels of cholesterol and blood lipids  You should start having your blood tested for lipids and cholesterol at 37 years of age, then have this test every 5 years.  You may need to have your cholesterol levels checked more often if:  Your lipid or cholesterol levels are high.  You are older than 37 years of age.  You are at high risk for heart disease.  CANCER SCREENING   Lung Cancer  Lung cancer screening is recommended for adults 31-68 years old who are at high risk for lung cancer because of a history of smoking.  A yearly low-dose CT scan of the lungs is recommended for people who:  Currently smoke.  Have quit within the past 15 years.  Have at least a 30-pack-year history of smoking. A pack year is smoking an average of one pack of cigarettes a day for 1 year.  Yearly screening should continue until it has been 15 years since you quit.  Yearly screening should stop if you develop a health problem that would prevent you from having lung cancer treatment.  Breast Cancer  Practice breast self-awareness. This means understanding how your breasts normally appear and feel.  It also means doing regular breast self-exams. Let your health care provider know about any changes, no matter how small.  If you are in your 20s or 30s, you  should have a clinical breast exam (CBE) by a health care provider every 1-3 years as part of a regular health exam.  If you are 84 or older, have a CBE every year. Also consider having a breast X-ray (mammogram) every year.  If you have a family history of breast cancer, talk to your health care provider about genetic screening.  If you are at high risk for breast cancer, talk to your health care provider about having an MRI and a mammogram every year.  Breast cancer gene (BRCA) assessment is recommended for women who have family  members with BRCA-related cancers. BRCA-related cancers include:  Breast.  Ovarian.  Tubal.  Peritoneal cancers.  Results of the assessment will determine the need for genetic counseling and BRCA1 and BRCA2 testing. Cervical Cancer Routine pelvic examinations to screen for cervical cancer are no longer recommended for nonpregnant women who are considered low risk for cancer of the pelvic organs (ovaries, uterus, and vagina) and who do not have symptoms. A pelvic examination may be necessary if you have symptoms including those associated with pelvic infections. Ask your health care provider if a screening pelvic exam is right for you.   The Pap test is the screening test for cervical cancer for women who are considered at risk.  If you had a hysterectomy for a problem that was not cancer or a condition that could lead to cancer, then you no longer need Pap tests.  If you are older than 65 years, and you have had normal Pap tests for the past 10 years, you no longer need to have Pap tests.  If you have had past treatment for cervical cancer or a condition that could lead to cancer, you need Pap tests and screening for cancer for at least 20 years after your treatment.  If you no longer get a Pap test, assess your risk factors if they change (such as having a new sexual partner). This can affect whether you should start being screened again.  Some women have medical problems that increase their chance of getting cervical cancer. If this is the case for you, your health care provider may recommend more frequent screening and Pap tests.  The human papillomavirus (HPV) test is another test that may be used for cervical cancer screening. The HPV test looks for the virus that can cause cell changes in the cervix. The cells collected during the Pap test can be tested for HPV.  The HPV test can be used to screen women 59 years of age and older. Getting tested for HPV can extend the interval  between normal Pap tests from three to five years.  An HPV test also should be used to screen women of any age who have unclear Pap test results.  After 37 years of age, women should have HPV testing as often as Pap tests.  Colorectal Cancer  This type of cancer can be detected and often prevented.  Routine colorectal cancer screening usually begins at 37 years of age and continues through 37 years of age.  Your health care provider may recommend screening at an earlier age if you have risk factors for colon cancer.  Your health care provider may also recommend using home test kits to check for hidden blood in the stool.  A small camera at the end of a tube can be used to examine your colon directly (sigmoidoscopy or colonoscopy). This is done to check for the earliest forms of colorectal cancer.  Routine screening  usually begins at age 35.  Direct examination of the colon should be repeated every 5-10 years through 37 years of age. However, you may need to be screened more often if early forms of precancerous polyps or small growths are found. Skin Cancer  Check your skin from head to toe regularly.  Tell your health care provider about any new moles or changes in moles, especially if there is a change in a mole's shape or color.  Also tell your health care provider if you have a mole that is larger than the size of a pencil eraser.  Always use sunscreen. Apply sunscreen liberally and repeatedly throughout the day.  Protect yourself by wearing long sleeves, pants, a wide-brimmed hat, and sunglasses whenever you are outside. HEART DISEASE, DIABETES, AND HIGH BLOOD PRESSURE   Have your blood pressure checked at least every 1-2 years. High blood pressure causes heart disease and increases the risk of stroke.  If you are between 54 years and 23 years old, ask your health care provider if you should take aspirin to prevent strokes.  Have regular diabetes screenings. This involves  taking a blood sample to check your fasting blood sugar level.  If you are at a normal weight and have a low risk for diabetes, have this test once every three years after 37 years of age.  If you are overweight and have a high risk for diabetes, consider being tested at a younger age or more often. PREVENTING INFECTION  Hepatitis B  If you have a higher risk for hepatitis B, you should be screened for this virus. You are considered at high risk for hepatitis B if:  You were born in a country where hepatitis B is common. Ask your health care provider which countries are considered high risk.  Your parents were born in a high-risk country, and you have not been immunized against hepatitis B (hepatitis B vaccine).  You have HIV or AIDS.  You use needles to inject street drugs.  You live with someone who has hepatitis B.  You have had sex with someone who has hepatitis B.  You get hemodialysis treatment.  You take certain medicines for conditions, including cancer, organ transplantation, and autoimmune conditions. Hepatitis C  Blood testing is recommended for:  Everyone born from [birth year] through 1967.  Anyone with known risk factors for hepatitis C. Sexually transmitted infections (STIs)  You should be screened for sexually transmitted infections (STIs) including gonorrhea and chlamydia if:  You are sexually active and are younger than 37 years of age.  You are older than 36 years of age and your health care provider tells you that you are at risk for this type of infection.  Your sexual activity has changed since you were last screened and you are at an increased risk for chlamydia or gonorrhea. Ask your health care provider if you are at risk.  If you do not have HIV, but are at risk, it may be recommended that you take a prescription medicine daily to prevent HIV infection. This is called pre-exposure prophylaxis (PrEP). You are considered at risk if:  You are sexually active  and do not regularly use condoms or know the HIV status of your partner(s).  You take drugs by injection.  You are sexually active with a partner who has HIV. Talk with your health care provider about whether you are at high risk of being infected with HIV. If you choose to begin PrEP, you should first be tested  for HIV. You should then be tested every 3 months for as long as you are taking PrEP.  PREGNANCY   If you are premenopausal and you may become pregnant, ask your health care provider about preconception counseling.  If you may become pregnant, take 400 to 800 micrograms (mcg) of folic acid every day.  If you want to prevent pregnancy, talk to your health care provider about birth control (contraception). OSTEOPOROSIS AND MENOPAUSE   Osteoporosis is a disease in which the bones lose minerals and strength with aging. This can result in serious bone fractures. Your risk for osteoporosis can be identified using a bone density scan.  If you are 23 years of age or older, or if you are at risk for osteoporosis and fractures, ask your health care provider if you should be screened.  Ask your health care provider whether you should take a calcium or vitamin D supplement to lower your risk for osteoporosis.  Menopause may have certain physical symptoms and risks.  Hormone replacement therapy may reduce some of these symptoms and risks. Talk to your health care provider about whether hormone replacement therapy is right for you.  HOME CARE INSTRUCTIONS   Schedule regular health, dental, and eye exams.  Stay current with your immunizations.   Do not use any tobacco products including cigarettes, chewing tobacco, or electronic cigarettes.  If you are pregnant, do not drink alcohol.  If you are breastfeeding, limit how much and how often you drink alcohol.  Limit alcohol intake to no more than 1 drink per day for nonpregnant women. One drink equals 12 ounces of beer, 5 ounces of wine,  or 1 ounces of hard liquor.  Do not use street drugs.  Do not share needles.  Ask your health care provider for help if you need support or information about quitting drugs.  Tell your health care provider if you often feel depressed.  Tell your health care provider if you have ever been abused or do not feel safe at home. Document Released: 10/05/2010 Document Revised: 08/06/2013 Document Reviewed: 02/21/2013 [facility] Patient Information 2015 [place], [place]. This information is not intended to replace advice given to you by your health care provider. Make sure you discuss any questions you have with your health care provider.

## 2015-04-25 ENCOUNTER — Other Ambulatory Visit: Payer: Self-pay | Admitting: Gynecology

## 2015-04-25 DIAGNOSIS — D500 Iron deficiency anemia secondary to blood loss (chronic): Secondary | ICD-10-CM

## 2015-04-25 DIAGNOSIS — D649 Anemia, unspecified: Secondary | ICD-10-CM

## 2015-04-25 LAB — GC/CHLAMYDIA PROBE AMP
CT PROBE, AMP APTIMA: NOT DETECTED
GC PROBE AMP APTIMA: NOT DETECTED

## 2015-04-25 LAB — URINALYSIS W MICROSCOPIC + REFLEX CULTURE
Bacteria, UA: NONE SEEN [HPF]
Bilirubin Urine: NEGATIVE
CASTS: NONE SEEN [LPF]
CRYSTALS: NONE SEEN [HPF]
Glucose, UA: NEGATIVE
Hgb urine dipstick: NEGATIVE
Leukocytes, UA: NEGATIVE
Nitrite: NEGATIVE
Protein, ur: NEGATIVE
SPECIFIC GRAVITY, URINE: 1.024 (ref 1.001–1.035)
YEAST: NONE SEEN [HPF]
pH: 6.5 (ref 5.0–8.0)

## 2015-04-26 LAB — URINE CULTURE: Colony Count: >=100000

## 2015-04-29 ENCOUNTER — Encounter: Payer: Self-pay | Admitting: Gynecology

## 2015-04-29 LAB — CYTOLOGY - PAP

## 2015-05-13 ENCOUNTER — Ambulatory Visit (INDEPENDENT_AMBULATORY_CARE_PROVIDER_SITE_OTHER): Payer: Commercial Managed Care - PPO | Admitting: Gynecology

## 2015-05-13 ENCOUNTER — Encounter: Payer: Self-pay | Admitting: Gynecology

## 2015-05-13 VITALS — BP 124/76

## 2015-05-13 DIAGNOSIS — R87810 Cervical high risk human papillomavirus (HPV) DNA test positive: Secondary | ICD-10-CM

## 2015-05-13 NOTE — Progress Notes (Signed)
Alexandra Morgan 1981-01-10 [id number]        35 y.o.  G1P0010 Presents for colposcopy. History of Pap smear last year showing normal cytology but positive high risk HPV, negative subtype 16, 18/45. Pap smear this year showed normal cytology with positive high-risk HPV. No history of significant abnormal Pap smears previously.  Past medical history,surgical history, problem list, medications, allergies, family history and social history were all reviewed and documented in the EPIC chart.  Directed ROS with pertinent positives and negatives documented in the history of present illness/assessment and plan.  Exam: [name] assistant Filed Vitals:   05/13/15 1059  BP: 124/76   General appearance:  Normal External BUS vagina normal. Cervix grossly normal. Bimanual without gross masses or tenderness.  Colposcopy performed after acetic acid cleanse is inadequate with no TZ seen. No abnormalities were seen. ECC performed. Patient tolerated well  Assessment/Plan:  35 y.o. G1P0010 with history of persistent high risk HPV in last 2 Pap smears with normal cytology. Colposcopy normal but inadequate. ECC performed. Assuming negative them plan repeat Pap smear in one year. If otherwise them well triaged based upon results. I reviewed the whole issue of positive HPV and its association with condyloma and dysplasia. The issues of high risk/low risk, progression/regression discussed. Patient will follow up for biopsy results.    [name] MD, 11:17 AM 05/13/2015

## 2015-05-13 NOTE — Patient Instructions (Signed)
Office will call you with biopsy results 

## 2015-07-24 ENCOUNTER — Other Ambulatory Visit: Payer: Commercial Managed Care - PPO

## 2015-07-24 DIAGNOSIS — D500 Iron deficiency anemia secondary to blood loss (chronic): Secondary | ICD-10-CM

## 2015-07-24 LAB — CBC WITH DIFFERENTIAL/PLATELET
BASOS ABS: 0 {cells}/uL (ref 0–200)
Basophils Relative: 0 %
EOS ABS: 264 {cells}/uL (ref 15–500)
EOS PCT: 4 %
HCT: 32.8 % — ABNORMAL LOW (ref 35.0–45.0)
Hemoglobin: 10.3 g/dL — ABNORMAL LOW (ref 11.7–15.5)
LYMPHS PCT: 41 %
Lymphs Abs: 2706 cells/uL (ref 850–3900)
MCH: 23.8 pg — AB (ref 27.0–33.0)
MCHC: 31.4 g/dL — AB (ref 32.0–36.0)
MCV: 75.9 fL — AB (ref 80.0–100.0)
MONOS PCT: 6 %
MPV: 9.5 fL (ref 7.5–12.5)
Monocytes Absolute: 396 cells/uL (ref 200–950)
NEUTROS ABS: 3234 {cells}/uL (ref 1500–7800)
NEUTROS PCT: 49 %
PLATELETS: 376 10*3/uL (ref 140–400)
RBC: 4.32 MIL/uL (ref 3.80–5.10)
RDW: 19.6 % — AB (ref 11.0–15.0)
WBC: 6.6 10*3/uL (ref 3.8–10.8)

## 2015-11-25 ENCOUNTER — Ambulatory Visit (INDEPENDENT_AMBULATORY_CARE_PROVIDER_SITE_OTHER): Payer: Commercial Managed Care - PPO | Admitting: Physician Assistant

## 2015-11-25 VITALS — BP 122/82 | HR 96 | Temp 98.7°F | Resp 17 | Ht 68.0 in | Wt 325.0 lb

## 2015-11-25 DIAGNOSIS — J309 Allergic rhinitis, unspecified: Secondary | ICD-10-CM | POA: Diagnosis not present

## 2015-11-25 DIAGNOSIS — J4520 Mild intermittent asthma, uncomplicated: Secondary | ICD-10-CM | POA: Diagnosis not present

## 2015-11-25 DIAGNOSIS — M62838 Other muscle spasm: Secondary | ICD-10-CM

## 2015-11-25 MED ORDER — IBUPROFEN 800 MG PO TABS: 800.0000 mg | ORAL_TABLET | Freq: Three times a day (TID) | ORAL | 1 refills | Status: DC | PRN

## 2015-11-25 MED ORDER — FLUTICASONE PROPIONATE 50 MCG/ACT NA SUSP: 2.0000 | Freq: Every day | NASAL | 12 refills | Status: DC

## 2015-11-25 NOTE — Progress Notes (Signed)
Alexandra Morgan  MRN: [id number] DOB: [birth year]  Subjective:  Pt presents to clinic with right sided mid back pain that happened this am when she was showering with an injury that she knows of. The area hurts more with movement.  She is concerned because she has asthma and she wants to make sure she is not having problems with her lungs.  She is having some mild asthma problems with a slight increase in albuterol use over the last week.  She currently uses nothing for her allergies.  She has no air conditioning in her house because it broke and it has not been fixed yet.  She has been under a lot of stress - her mother is being treated for a recent diagnosis of bone cancer and her dad recently died from an MI -- she works in [employer] and she has been traveling to her parents home on the weekends to help with her mothers care - as a result she is sleep deprived and stressed out.  Motrin 800mg  - this am - the pain is slightly improved after taking it  Review of Systems  HENT: Positive for congestion. Negative for postnasal drip, rhinorrhea and sore throat.   Respiratory: Positive for cough. Negative for shortness of breath.   Neurological: Positive for headaches (a few days ago).  Psychiatric/Behavioral: Positive for sleep disturbance (stress and family situation).    There are no active problems to display for this patient.   Current Outpatient Prescriptions on File Prior to Visit  Medication Sig Dispense Refill  . albuterol (PROVENTIL HFA;VENTOLIN HFA) 108 (90 BASE) MCG/ACT inhaler Inhale 2 puffs into the lungs every 4 (four) hours as needed for wheezing or shortness of breath.    . fluticasone (FLOVENT HFA) 110 MCG/ACT inhaler Inhale into the lungs 2 (two) times daily.    . folic acid (FOLVITE) A999333 MCG tablet Take 400 mcg by mouth daily.    . Guaifenesin (MUCINEX MAXIMUM STRENGTH) 1200 MG TB12 Take 1 tablet (1,200 mg total) by mouth every 12 (twelve) hours as needed. 14 tablet 1  .  Lactobacillus (ACIDOPHILUS PO) Take 1 tablet by mouth daily.    Marland Kitchen loratadine (CLARITIN) 10 MG tablet Take 1 tablet (10 mg total) by mouth daily. 30 tablet 11  . Multiple Vitamins-Minerals (MULTIVITAMIN WITH MINERALS) tablet Take 1 tablet by mouth daily.    . vitamin C (ASCORBIC ACID) 500 MG tablet Take 1,000 mg by mouth daily.     No current facility-administered medications on file prior to visit.     No Known Allergies  Pt patients past, family and social history were reviewed and updated.  Objective:  BP 122/82 (BP Location: Right Arm, Patient Position: Sitting, Cuff Size: Large)   Pulse 96   Temp 98.7 F (37.1 C) (Oral)   Resp 17   Ht 5\' 8"  (1.727 m)   Wt (!) 325 lb (147.4 kg)   LMP 11/15/2015   SpO2 99%   BMI 49.42 kg/m   Physical Exam  Constitutional: She is oriented to person, place, and time and well-developed, well-nourished, and in no distress.  HENT:  Head: Normocephalic and atraumatic.  Right Ear: Hearing, tympanic membrane, external ear and ear canal normal.  Left Ear: Hearing, tympanic membrane, external ear and ear canal normal.  Nose: Nose normal.  Mouth/Throat: Uvula is midline, oropharynx is clear and moist and mucous membranes are normal.  Eyes: Conjunctivae are normal.  Neck: Normal range of motion.  Cardiovascular: Normal rate, regular rhythm and  normal heart sounds.   No murmur heard. Pulmonary/Chest: Effort normal. She has no decreased breath sounds. She has wheezes (mild end expiratory wheezing at bases).  Musculoskeletal:       Thoracic back: She exhibits tenderness (along right sided lower rib cage ). She exhibits normal range of motion and no spasm.  Pain is reproducible with touch and with arm movements and it increases with resistance   Neurological: She is alert and oriented to person, place, and time. Gait normal.  Skin: Skin is warm and dry.  Psychiatric: Mood, memory, affect and judgment normal.  Vitals reviewed.   Assessment and Plan :    Asthma, mild intermittent, uncomplicated - allergies need to be treated esp during the fall to prevent further worsening of her asthma - if this does not help or she needs further treatment singulair would be a good option for her  Allergic rhinitis, unspecified allergic rhinitis type - Plan: fluticasone (FLONASE) 50 MCG/ACT nasal spray  Muscle spasm - Plan: ibuprofen (ADVIL,MOTRIN) 800 MG tablet  Pt will use heat and rest - she is concerned about muscle relaxers because of her current situation and she would like to just watch and wait  Her questions were answered and she understands the plan.  Windell Hummingbird PA-C  [facility] 11/25/2015 11:43 AM

## 2015-11-25 NOTE — Patient Instructions (Addendum)
Heat and ice to the area    IF you received an x-ray today, you will receive an invoice from [facility]. Please contact [facility] at [phone] with questions or concerns regarding your invoice.   IF you received labwork today, you will receive an invoice from [employer]. Please contact [facility] at [phone] with questions or concerns regarding your invoice.   Our billing staff will not be able to assist you with questions regarding bills from these companies.  You will be contacted with the lab results as soon as they are available. The fastest way to get your results is to activate your My Chart account. Instructions are located on the last page of this paperwork. If you have not heard from [place] regarding the results in 2 weeks, please contact this office.

## 2015-12-17 ENCOUNTER — Ambulatory Visit (INDEPENDENT_AMBULATORY_CARE_PROVIDER_SITE_OTHER): Payer: Commercial Managed Care - PPO | Admitting: Physician Assistant

## 2015-12-17 ENCOUNTER — Ambulatory Visit (INDEPENDENT_AMBULATORY_CARE_PROVIDER_SITE_OTHER): Payer: Commercial Managed Care - PPO

## 2015-12-17 ENCOUNTER — Telehealth: Payer: Self-pay

## 2015-12-17 VITALS — BP 136/80 | HR 100 | Temp 98.8°F | Resp 18 | Ht 68.0 in | Wt 330.0 lb

## 2015-12-17 DIAGNOSIS — R062 Wheezing: Secondary | ICD-10-CM

## 2015-12-17 DIAGNOSIS — J4541 Moderate persistent asthma with (acute) exacerbation: Secondary | ICD-10-CM

## 2015-12-17 MED ORDER — IPRATROPIUM BROMIDE 0.02 % IN SOLN
0.5000 mg | Freq: Once | RESPIRATORY_TRACT | Status: AC
  Administered 2015-12-17: 0.5 mg via RESPIRATORY_TRACT

## 2015-12-17 MED ORDER — LORATADINE 10 MG PO TABS: 10.0000 mg | ORAL_TABLET | Freq: Every day | ORAL | 11 refills | Status: DC

## 2015-12-17 MED ORDER — ALBUTEROL SULFATE (2.5 MG/3ML) 0.083% IN NEBU
2.5000 mg | INHALATION_SOLUTION | Freq: Once | RESPIRATORY_TRACT | Status: AC
  Administered 2015-12-17: 2.5 mg via RESPIRATORY_TRACT

## 2015-12-17 MED ORDER — PREDNISONE 20 MG PO TABS: ORAL_TABLET | ORAL | 0 refills | Status: AC

## 2015-12-17 NOTE — Progress Notes (Addendum)
Patient ID: Alexandra Morgan, female   DOB: 24-Apr-1980, 35 y.o.   MRN: [id number] [facility] [address], [place] [address] [phone]  By signing my name below, I, Alexandra Morgan, attest that this documentation has been prepared under the direction and in the presence of Ivar Drape, PA-C Electronically Signed: [name], ED Scribe 12/17/2015 at 8:54 AM.  Date:  12/17/2015   Name:  Alexandra Morgan   DOB:  1980/07/06   MRN:  [id number]  PCP:  Harrison Mons, PA-C   History of Present Illness:  Alexandra Morgan is a 35 y.o. female patient, with a h/o asthma, who presents to [facility] for reevaluation of a cough. Pt was seen here 3 weeks ago with concern of having problems with her lungs after an injury to her mid back. Pt states that the cough become productive this morning. She states that she stopped her Flonase a few days ago, has taking 1200 mg Mucinex down to the last 3 pills and is taking her albuterol inhaler x 2-3 daily. She is not currently taking Claritin. She states that Zyrtec makes her drowsy and Delma Freeze makes her sick. She denies fever and ear pain.  There are no active problems to display for this patient.   Past Medical History:  Diagnosis Date   Asthma    Chlamydia 05/2014   Fibroid    HPV test positive 04/2014, 04/2015   normal cytology, negative subtype 16, 18/45. Recommend repeat Pap smear/HPV one year   Migraines     Past Surgical History:  Procedure Laterality Date   ABDOMINAL SURGERY  05/10   ABDOMINAL MYOMECTOMY Dr Elby Showers   DENTAL SURGERY     INCISION AND DRAINAGE BREAST ABSCESS      Social History  Substance Use Topics   Smoking status: Never Smoker   Smokeless tobacco: Never Used   Alcohol use 0.0 oz/week     Comment: Rare    Family History  Problem Relation Age of Onset   Heart disease Father    Hypertension Father    Diabetes Father    Stroke Father    Hypertension Sister    Diabetes Brother     Hypertension Brother     No Known Allergies  Medication list has been reviewed and updated.  Current Outpatient Prescriptions on File Prior to Visit  Medication Sig Dispense Refill   albuterol (PROVENTIL HFA;VENTOLIN HFA) 108 (90 BASE) MCG/ACT inhaler Inhale 2 puffs into the lungs every 4 (four) hours as needed for wheezing or shortness of breath.     fluticasone (FLONASE) 50 MCG/ACT nasal spray Place 2 sprays into both nostrils daily. 16 g 12   fluticasone (FLOVENT HFA) 110 MCG/ACT inhaler Inhale into the lungs 2 (two) times daily.     folic acid (FOLVITE) A999333 MCG tablet Take 400 mcg by mouth daily.     ibuprofen (ADVIL,MOTRIN) 800 MG tablet Take 1 tablet (800 mg total) by mouth every 8 (eight) hours as needed. 90 tablet 1   Lactobacillus (ACIDOPHILUS PO) Take 1 tablet by mouth daily.     loratadine (CLARITIN) 10 MG tablet Take 1 tablet (10 mg total) by mouth daily. 30 tablet 11   Multiple Vitamins-Minerals (MULTIVITAMIN WITH MINERALS) tablet Take 1 tablet by mouth daily.     vitamin C (ASCORBIC ACID) 500 MG tablet Take 1,000 mg by mouth daily.     No current facility-administered medications on file prior to visit.     Review of Systems  Constitutional:  Negative for fever.  HENT: Negative for ear pain.   Respiratory: Positive for cough.     Physical Examination: BP 136/80 (BP Location: Left Arm, Patient Position: Sitting, Cuff Size: Large)    Pulse 100    Temp 98.8 F (37.1 C) (Oral)    Resp 18    Ht 5\' 8"  (1.727 m)    Wt (!) 330 lb (149.7 kg)    LMP 12/13/2015    SpO2 99%    BMI 50.18 kg/m  Ideal Body Weight: @FLOWAMB [id number]  Physical Exam  Constitutional: She is oriented to person, place, and time. She appears well-developed and well-nourished. No distress.  HENT:  Head: Normocephalic and atraumatic.  Right Ear: External ear normal.  Left Ear: External ear normal.  Eyes: Conjunctivae and EOM are normal. Pupils are equal, round, and reactive to light.   Cardiovascular: Normal rate, regular rhythm, normal heart sounds and normal pulses.   Pulmonary/Chest: Effort normal. No respiratory distress. She has decreased breath sounds. She has wheezes.  Upper lobes has some expiratory wheezes. Decreased breath sounds with expiration.   Neurological: She is alert and oriented to person, place, and time.  Skin: She is not diaphoretic.  Psychiatric: She has a normal mood and affect. Her behavior is normal.  Dg Chest 2 View  Result Date: 12/17/2015 CLINICAL DATA:  Productive cough and wheezing for 3 weeks, asthma. EXAM: CHEST  2 VIEW COMPARISON:  03/20/2004. FINDINGS: Trachea is midline. Heart size normal. Lungs are somewhat low in volume but clear. No pleural fluid. IMPRESSION: No acute findings. Electronically Signed   By: Lorin Picket M.D.   On: 12/17/2015 10:26    Assessment and Plan: Alexandra Morgan is a 35 y.o. female who is here today for asthma exacerbation. --given duo neb. With recorded improvement.  I will give her a short tapering course.  Instructed in the mean time, we will start our preventative medication twice per day, and the claritin. --advised to contact me if no improvement within the next 48-72 hours.  She voiced understanding.  Asthma with exacerbation, moderate persistent - Plan: loratadine (CLARITIN) 10 MG tablet, predniSONE (DELTASONE) 20 MG tablet, DG Chest 2 View  Wheezing - Plan: ipratropium (ATROVENT) nebulizer solution 0.5 mg, albuterol (PROVENTIL) (2.5 MG/3ML) 0.083% nebulizer solution 2.5 mg, loratadine (CLARITIN) 10 MG tablet, predniSONE (DELTASONE) 20 MG tablet, DG Chest 2 View  Ivar Drape, PA-C [facility] 12/17/2015 8:54 AM

## 2015-12-17 NOTE — Patient Instructions (Addendum)
Please do the flovent twice per day as prescribed.  I would like you to also take the claritin every day.  If you are not having improvement of your symptoms within the next 48 hours, you need to let us know.  Asthma, Acute Bronchospasm Acute bronchospasm caused by asthma is also referred to as an asthma attack. Bronchospasm means your air passages become narrowed. The narrowing is caused by inflammation and tightening of the muscles in the air tubes (bronchi) in your lungs. This can make it hard to breathe or cause you to wheeze and cough. CAUSES Possible triggers are:  Animal dander from the skin, hair, or feathers of animals.  Dust mites contained in house dust.  Cockroaches.  Pollen from trees or grass.  Mold.  Cigarette or tobacco smoke.  Air pollutants such as dust, household cleaners, hair sprays, aerosol sprays, paint fumes, strong chemicals, or strong odors.  Cold air or weather changes. Cold air may trigger inflammation. Winds increase molds and pollens in the air.  Strong emotions such as crying or laughing hard.  Stress.  Certain medicines such as aspirin or beta-blockers.  Sulfites in foods and drinks, such as dried fruits and wine.  Infections or inflammatory conditions, such as a flu, cold, or inflammation of the nasal membranes (rhinitis).  Gastroesophageal reflux disease (GERD). GERD is a condition where stomach acid backs up into your esophagus.  Exercise or strenuous activity. SIGNS AND SYMPTOMS   Wheezing.  Excessive coughing, particularly at night.  Chest tightness.  Shortness of breath. DIAGNOSIS  Your health care provider will ask you about your medical history and perform a physical exam. A chest X-ray or blood testing may be performed to look for other causes of your symptoms or other conditions that may have triggered your asthma attack. TREATMENT  Treatment is aimed at reducing inflammation and opening up the airways in your lungs. Most  asthma attacks are treated with inhaled medicines. These include quick relief or rescue medicines (such as bronchodilators) and controller medicines (such as inhaled corticosteroids). These medicines are sometimes given through an inhaler or a nebulizer. Systemic steroid medicine taken by mouth or given through an IV tube also can be used to reduce the inflammation when an attack is moderate or severe. Antibiotic medicines are only used if a bacterial infection is present.  HOME CARE INSTRUCTIONS   Rest.  Drink plenty of liquids. This helps the mucus to remain thin and be easily coughed up. Only use caffeine in moderation and do not use alcohol until you have recovered from your illness.  Do not smoke. Avoid being exposed to secondhand smoke.  You play a critical role in keeping yourself in good health. Avoid exposure to things that cause you to wheeze or to have breathing problems.  Keep your medicines up-to-date and available. Carefully follow your health care provider's treatment plan.  Take your medicine exactly as prescribed.  When pollen or pollution is bad, keep windows closed and use an air conditioner or go to places with air conditioning.  Asthma requires careful medical care. See your health care provider for a follow-up as advised. If you are more than [redacted] weeks pregnant and you were prescribed any new medicines, let your obstetrician know about the visit and how you are doing. Follow up with your health care provider as directed.  After you have recovered from your asthma attack, make an appointment with your outpatient doctor to talk about ways to reduce the likelihood of future attacks. If  you do not have a doctor who manages your asthma, make an appointment with a primary care doctor to discuss your asthma. SEEK IMMEDIATE MEDICAL CARE IF:   You are getting worse.  You have trouble breathing. If severe, call your local emergency services (911 in the [place]  You develop chest  pain or discomfort.  You are vomiting.  You are not able to keep fluids down.  You are coughing up yellow, green, brown, or bloody sputum.  You have a fever and your symptoms suddenly get worse.  You have trouble swallowing. MAKE SURE YOU:   Understand these instructions.  Will watch your condition.  Will get help right away if you are not doing well or get worse.   This information is not intended to replace advice given to you by your health care provider. Make sure you discuss any questions you have with your health care provider.   Document Released: 07/07/2006 Document Revised: 03/27/2013 Document Reviewed: 09/27/2012 Elsevier Interactive Patient Education 2016 [employer].     IF you received an x-ray today, you will receive an invoice from [facility]. Please contact [facility] at [phone] with questions or concerns regarding your invoice.   IF you received labwork today, you will receive an invoice from [employer]. Please contact [facility] at [phone] with questions or concerns regarding your invoice.   Our billing staff will not be able to assist you with questions regarding bills from these companies.  You will be contacted with the lab results as soon as they are available. The fastest way to get your results is to activate your My Chart account. Instructions are located on the last page of this paperwork. If you have not heard from [place] regarding the results in 2 weeks, please contact this office.

## 2015-12-17 NOTE — Telephone Encounter (Signed)
Patient was just seen and states that we forget to sent refill for Flovent and ventolin

## 2015-12-18 MED ORDER — ALBUTEROL SULFATE HFA 108 (90 BASE) MCG/ACT IN AERS: 2.0000 | INHALATION_SPRAY | RESPIRATORY_TRACT | 5 refills | Status: DC | PRN

## 2015-12-18 MED ORDER — FLUTICASONE PROPIONATE HFA 110 MCG/ACT IN AERO: 1.0000 | INHALATION_SPRAY | Freq: Two times a day (BID) | RESPIRATORY_TRACT | 10 refills | Status: DC

## 2016-04-26 ENCOUNTER — Ambulatory Visit (INDEPENDENT_AMBULATORY_CARE_PROVIDER_SITE_OTHER): Payer: Commercial Managed Care - PPO | Admitting: Gynecology

## 2016-04-26 ENCOUNTER — Encounter: Payer: Self-pay | Admitting: Gynecology

## 2016-04-26 VITALS — BP 124/80 | Ht 68.0 in | Wt 331.0 lb

## 2016-04-26 DIAGNOSIS — L918 Other hypertrophic disorders of the skin: Secondary | ICD-10-CM | POA: Diagnosis not present

## 2016-04-26 DIAGNOSIS — D259 Leiomyoma of uterus, unspecified: Secondary | ICD-10-CM

## 2016-04-26 DIAGNOSIS — Z1151 Encounter for screening for human papillomavirus (HPV): Secondary | ICD-10-CM | POA: Diagnosis not present

## 2016-04-26 DIAGNOSIS — Z01419 Encounter for gynecological examination (general) (routine) without abnormal findings: Secondary | ICD-10-CM | POA: Diagnosis not present

## 2016-04-26 DIAGNOSIS — R87810 Cervical high risk human papillomavirus (HPV) DNA test positive: Secondary | ICD-10-CM

## 2016-04-26 DIAGNOSIS — Z13220 Encounter for screening for lipoid disorders: Secondary | ICD-10-CM

## 2016-04-26 DIAGNOSIS — Z113 Encounter for screening for infections with a predominantly sexual mode of transmission: Secondary | ICD-10-CM

## 2016-04-26 LAB — LIPID PANEL
CHOL/HDL RATIO: 3 ratio (ref <5.0)
CHOLESTEROL: 178 mg/dL (ref <200)
HDL: 59 mg/dL (ref >50)
LDL CALC: 102 mg/dL — AB (ref <100)
Triglycerides: 84 mg/dL (ref <150)
VLDL: 17 mg/dL (ref <30)

## 2016-04-26 LAB — CBC WITH DIFFERENTIAL/PLATELET
BASOS ABS: 0 {cells}/uL (ref 0–200)
Basophils Relative: 0 %
EOS ABS: 558 {cells}/uL — AB (ref 15–500)
EOS PCT: 9 %
HCT: 32.4 % — ABNORMAL LOW (ref 35.0–45.0)
Hemoglobin: 9.7 g/dL — ABNORMAL LOW (ref 11.7–15.5)
Lymphocytes Relative: 48 %
Lymphs Abs: 2976 cells/uL (ref 850–3900)
MCH: 22.5 pg — AB (ref 27.0–33.0)
MCHC: 29.9 g/dL — ABNORMAL LOW (ref 32.0–36.0)
MCV: 75.2 fL — AB (ref 80.0–100.0)
MONOS PCT: 6 %
MPV: 8.9 fL (ref 7.5–12.5)
Monocytes Absolute: 372 cells/uL (ref 200–950)
Neutro Abs: 2294 cells/uL (ref 1500–7800)
Neutrophils Relative %: 37 %
PLATELETS: 439 10*3/uL — AB (ref 140–400)
RBC: 4.31 MIL/uL (ref 3.80–5.10)
RDW: 17.9 % — AB (ref 11.0–15.0)
WBC: 6.2 10*3/uL (ref 3.8–10.8)

## 2016-04-26 LAB — URINALYSIS W MICROSCOPIC + REFLEX CULTURE
BACTERIA UA: NONE SEEN [HPF]
BILIRUBIN URINE: NEGATIVE
CRYSTALS: NONE SEEN [HPF]
Casts: NONE SEEN [LPF]
Glucose, UA: NEGATIVE
Hgb urine dipstick: NEGATIVE
Ketones, ur: NEGATIVE
Leukocytes, UA: NEGATIVE
Nitrite: NEGATIVE
PROTEIN: NEGATIVE
RBC / HPF: NONE SEEN RBC/HPF (ref <=2)
Specific Gravity, Urine: 1.02 (ref 1.001–1.035)
WBC, UA: NONE SEEN WBC/HPF (ref <=5)
Yeast: NONE SEEN [HPF]
pH: 7.5 (ref 5.0–8.0)

## 2016-04-26 LAB — HEMOGLOBIN A1C
HEMOGLOBIN A1C: 6.4 % — AB (ref <5.7)
MEAN PLASMA GLUCOSE: 137 mg/dL

## 2016-04-26 LAB — COMPREHENSIVE METABOLIC PANEL
ALT: 17 U/L (ref 6–29)
AST: 15 U/L (ref 10–30)
Albumin: 3.8 g/dL (ref 3.6–5.1)
Alkaline Phosphatase: 84 U/L (ref 33–115)
BUN: 5 mg/dL — ABNORMAL LOW (ref 7–25)
CHLORIDE: 107 mmol/L (ref 98–110)
CO2: 23 mmol/L (ref 20–31)
Calcium: 9.2 mg/dL (ref 8.6–10.2)
Creat: 0.71 mg/dL (ref 0.50–1.10)
GLUCOSE: 106 mg/dL — AB (ref 65–99)
POTASSIUM: 4.5 mmol/L (ref 3.5–5.3)
SODIUM: 138 mmol/L (ref 135–146)
Total Bilirubin: 0.3 mg/dL (ref 0.2–1.2)
Total Protein: 6.9 g/dL (ref 6.1–8.1)

## 2016-04-26 NOTE — Progress Notes (Signed)
    Alexandra Morgan 1980/07/04 [id number]        36 y.o.  G1P0010 for annual exam.    Past medical history,surgical history, problem list, medications, allergies, family history and social history were all reviewed and documented as reviewed in the EPIC chart.  ROS:  Performed with pertinent positives and negatives included in the history, assessment and plan.   Additional significant findings :  None   Exam: [name] assistant Vitals:   04/26/16 0925  BP: 124/80  Weight: (!) 331 lb (150.1 kg)  Height: 5\' 8"  (1.727 m)   Body mass index is 50.33 kg/m.  General appearance:  Normal affect, orientation and appearance. Skin: Grossly normal. Small classic skin tag right iliac crest area HEENT: Without gross lesions.  No cervical or supraclavicular adenopathy. Thyroid normal.  Lungs:  Clear without wheezing, rales or rhonchi Cardiac: RR, without RMG Abdominal:  Soft, nontender, without masses, guarding, rebound, organomegaly or hernia Breasts:  Examined lying and sitting without masses, retractions, discharge or axillary adenopathy. Pelvic:  Ext, BUS, Vagina normal  Cervix normal. GC/Chlamydia,  Uterus difficult to palpate but no gross masses or tenderness  Adnexa without gross masses or tenderness    Anus and perineum normal   Rectovaginal normal sphincter tone without palpated masses or tenderness.    Assessment/Plan:  36 y.o. G27P0010 female for annual exam with regular menses, not currently sexually active..   1. Contraception. Patient not currently sexually active but has been so several months ago. I again reviewed contraceptive options with her. I suggested that she consider Mirena IUD as she notes that her menses are 5-7 days and this may help with that also. Alternatives to include oral contraceptives, Depo-Provera, Nexplanon reviewed. Patient will decide follow up for this. Has been using condoms in the past. She understands the failure risks with this and the availability  of Plan B. 2. History of leiomyoma. Prior ultrasound 2016 with 5 measurable myomas the largest in the 8 cm range. Patient without pelvic discomfort. Exam difficult due to abdominal girth but no gross masses. Issues of repeating an ultrasound now versus expectant management discussed. At this point the patient prefers not to repeat the ultrasound but to monitor. 3. History of Pap smear with normal cytology but positive high-risk HPV 04/2015. Prior Pap smear 2016 with normal cytology, positive high-risk HPV, negative subtype 16, 18/45. Colposcopy 2017 was inadequate but no abnormalities. ECC showed koilocytotic atypia but no significant dysplasia. Pap smear/HPV today. Will triage based on results. 4. History of chlamydia 2016. GC/chlamydia screen done today. 5. Screening mammographic recommendations reviewed. No strong family history of rest cancer and patient prefers to wait till 57 for her baseline. SBE monthly reviewed. 6. Skin tag, classic in appearance right hip region. Not bothersome to the patient. Will monitor report of the changes to have it excised. 7. Health maintenance. Requests baseline labs. CBC, CMP, hemoglobin A1c, lipid profile, urinalysis, GC/chlamydia ordered. Follow up in one year, sooner if she decides for Mirena IUD   [name] MD, 10:00 AM 04/26/2016

## 2016-04-26 NOTE — Patient Instructions (Signed)
Follow up for your Pap smear results  Follow up for the Mirena IUD if you choose.

## 2016-04-26 NOTE — Addendum Note (Signed)
Addended by: Nelva Nay on: 04/26/2016 10:20 AM   Modules accepted: Orders

## 2016-04-27 ENCOUNTER — Other Ambulatory Visit: Payer: Self-pay | Admitting: Gynecology

## 2016-04-27 DIAGNOSIS — D500 Iron deficiency anemia secondary to blood loss (chronic): Secondary | ICD-10-CM

## 2016-04-27 DIAGNOSIS — R7309 Other abnormal glucose: Secondary | ICD-10-CM

## 2016-04-27 LAB — PAP IG AND HPV HIGH-RISK: HPV DNA HIGH RISK: NOT DETECTED

## 2016-04-27 LAB — GC/CHLAMYDIA PROBE AMP
CT Probe RNA: NOT DETECTED
GC PROBE AMP APTIMA: NOT DETECTED

## 2016-04-29 ENCOUNTER — Encounter: Payer: Self-pay | Admitting: Emergency Medicine

## 2016-04-29 ENCOUNTER — Emergency Department (INDEPENDENT_AMBULATORY_CARE_PROVIDER_SITE_OTHER): Payer: Commercial Managed Care - PPO

## 2016-04-29 ENCOUNTER — Emergency Department (INDEPENDENT_AMBULATORY_CARE_PROVIDER_SITE_OTHER)
Admission: EM | Admit: 2016-04-29 | Discharge: 2016-04-29 | Disposition: A | Discharge status: Home / Self Care | Payer: Commercial Managed Care - PPO | Source: Home / Self Care | Attending: Family Medicine | Admitting: Family Medicine

## 2016-04-29 DIAGNOSIS — R05 Cough: Secondary | ICD-10-CM | POA: Diagnosis not present

## 2016-04-29 DIAGNOSIS — R053 Chronic cough: Secondary | ICD-10-CM

## 2016-04-29 MED ORDER — BENZONATATE 200 MG PO CAPS: ORAL_CAPSULE | ORAL | 0 refills | Status: DC

## 2016-04-29 MED ORDER — PREDNISONE 20 MG PO TABS: ORAL_TABLET | ORAL | 0 refills | Status: DC

## 2016-04-29 MED ORDER — AZITHROMYCIN 250 MG PO TABS: ORAL_TABLET | ORAL | 0 refills | Status: DC

## 2016-04-29 NOTE — ED Triage Notes (Signed)
Patient states she has been congested with cough with shortness of breath. She is out of daily inhaler due to expense; has used her albuterol inhaler today.

## 2016-04-29 NOTE — ED Provider Notes (Signed)
[name]    CSN: [id number] Arrival date & time: 04/29/16  1836     History   Chief Complaint Chief Complaint  Patient presents with  . Shortness of Breath  . Cough  . Nasal Congestion    HPI Alexandra Morgan is a 36 y.o. female.   Patient states that she had developed a cold about 3 weeks ago, and complains of sinus congestion and persistent cough since then, although she does not feel ill.  She has asthma and has run out of her rescue albuterol inhaler, having developed increased shortness of breath with activity and wheezing.  No pleuritic pain; no fevers, chills, and sweats. She states that she does use a Flovent inhaler.   The history is provided by the patient.    Past Medical History:  Diagnosis Date  . Asthma   . Chlamydia 05/2014  . Fibroid   . HPV test positive 04/2014, 04/2015   normal cytology, negative subtype 16, 18/45. Recommend repeat Pap smear/HPV one year  . Migraines     There are no active problems to display for this patient.   Past Surgical History:  Procedure Laterality Date  . ABDOMINAL SURGERY  05/10   ABDOMINAL MYOMECTOMY Dr Elby Showers  . DENTAL SURGERY    . INCISION AND DRAINAGE BREAST ABSCESS      OB History    Gravida Para Term Preterm AB Living   1       1 0   SAB TAB Ectopic Multiple Live Births     1             Home Medications    Prior to Admission medications   Medication Sig Start Date End Date Taking? Authorizing Provider  albuterol (PROVENTIL HFA;VENTOLIN HFA) 108 (90 Base) MCG/ACT inhaler Inhale 2 puffs into the lungs every 4 (four) hours as needed for wheezing or shortness of breath. 12/18/15   Dorian Heckle English, PA  azithromycin (ZITHROMAX Z-PAK) 250 MG tablet Take 2 tabs today; then begin one tab once daily for 4 more days. (Rx void after 05/07/16) 04/29/16   Kandra Nicolas, MD  benzonatate (TESSALON) 200 MG capsule Take one cap by mouth at bedtime as needed for cough.  May repeat in 4 to 6 hours 04/29/16    Kandra Nicolas, MD  fluticasone [facility]) 50 MCG/ACT nasal spray Place 2 sprays into both nostrils daily. 11/25/15   Mancel Bale, PA-C  fluticasone (FLOVENT HFA) 110 MCG/ACT inhaler Inhale 1 puff into the lungs 2 (two) times daily. 12/18/15   Dorian Heckle English, PA  folic acid (FOLVITE) A999333 MCG tablet Take 400 mcg by mouth daily.    Historical Provider, MD  ibuprofen (ADVIL,MOTRIN) 800 MG tablet Take 1 tablet (800 mg total) by mouth every 8 (eight) hours as needed. 11/25/15   Mancel Bale, PA-C  Lactobacillus (ACIDOPHILUS PO) Take 1 tablet by mouth daily.    Historical Provider, MD  loratadine (CLARITIN) 10 MG tablet Take 1 tablet (10 mg total) by mouth daily. 03/15/15   Joretta Bachelor, PA  Multiple Vitamins-Minerals (MULTIVITAMIN WITH MINERALS) tablet Take 1 tablet by mouth daily.    Historical Provider, MD  predniSONE (DELTASONE) 20 MG tablet Take one tab by mouth twice daily for 5 days, then one daily. Take with food. 04/29/16   Kandra Nicolas, MD    Family History Family History  Problem Relation Age of Onset  . Heart disease Father   . Hypertension Father   .  Diabetes Father   . Stroke Father   . Hypertension Sister   . Diabetes Brother   . Hypertension Brother   . Cancer Mother     Bone  . Stroke Maternal Grandfather     Social History Social History  Substance Use Topics  . Smoking status: Never Smoker  . Smokeless tobacco: Never Used  . Alcohol use 0.0 oz/week     Comment: Rare     Allergies   Patient has no known allergies.   Review of Systems Review of Systems No sore throat + cough No pleuritic pain + wheezing + nasal congestion + post-nasal drainage No sinus pain/pressure No itchy/red eyes No earache No hemoptysis + SOB No fever/chills No nausea No vomiting No abdominal pain No diarrhea No urinary symptoms No skin rash + fatigue No myalgias No headache    Physical Exam Triage Vital Signs ED Triage Vitals  Enc Vitals Group     BP  04/29/16 1859 130/80     Pulse Rate 04/29/16 1859 94     Resp 04/29/16 1859 18     Temp 04/29/16 1859 98.3 F (36.8 C)     Temp Source 04/29/16 1859 Oral     SpO2 04/29/16 1859 99 %     Weight 04/29/16 1900 (!) 330 lb (149.7 kg)     Height 04/29/16 1900 5\' 8"  (1.727 m)     Head Circumference --      Peak Flow --      Pain Score 04/29/16 1903 0     Pain Loc --      Pain Edu? --      Excl. in [place]? --    No data found.   Updated Vital Signs BP 130/80 (BP Location: Left Arm)   Pulse 94   Temp 98.3 F (36.8 C) (Oral)   Resp 18   Ht 5\' 8"  (1.727 m)   Wt (!) 330 lb (149.7 kg)   LMP 04/13/2016   SpO2 99%   BMI 50.18 kg/m   Visual Acuity Right Eye Distance:   Left Eye Distance:   Bilateral Distance:    Right Eye Near:   Left Eye Near:    Bilateral Near:     Physical Exam Nursing notes and Vital Signs reviewed. Appearance:  Patient appears stated age, and in no acute distress Eyes:  Pupils are equal, round, and reactive to light and accomodation.  Extraocular movement is intact.  Conjunctivae are not inflamed  Ears:  Canals normal.  Tympanic membranes normal.  Nose:  Mildly congested turbinates.  No sinus tenderness.  Maxillary sinus tenderness is present.  Pharynx:  Normal Neck:  Supple.  Tender enlarged posterior/lateral nodes are palpated bilaterally  Lungs:  Clear to auscultation.  Breath sounds are equal.  Moving air well. Heart:  Regular rate and rhythm without murmurs, rubs, or gallops.  Abdomen:  Nontender without masses or hepatosplenomegaly.  Bowel sounds are present.  No CVA or flank tenderness.  Extremities:  No edema.  Skin:  No rash present.    UC Treatments / Results  Labs (all labs ordered are listed, but only abnormal results are displayed) Labs Reviewed - No data to display  EKG  EKG Interpretation None       Radiology Dg Chest 2 View  Result Date: 04/29/2016 CLINICAL DATA:  Subacute onset of nonproductive cough. Initial encounter. EXAM:  CHEST  2 VIEW COMPARISON:  Chest radiograph performed 12/17/2015 FINDINGS: The lungs are well-aerated and clear. There is no  evidence of focal opacification, pleural effusion or pneumothorax. The heart is normal in size; the mediastinal contour is within normal limits. No acute osseous abnormalities are seen. IMPRESSION: No acute cardiopulmonary process seen. Electronically Signed   By: Garald Balding M.D.   On: 04/29/2016 19:16    Procedures Procedures (including critical care time)  Medications Ordered in UC Medications - No data to display   Initial Impression / Assessment and Plan / UC Course  I have reviewed the triage vital signs and the nursing notes.  Pertinent labs & imaging results that were available during my care of the patient were reviewed by me and considered in my medical decision making (see chart for details).    There is no evidence of bacterial infection today.  Suspect viral post-infectious cough. Begin prednisone burst/taper. Prescription written for Benzonatate [facility]) to take at bedtime for night-time cough.  Take plain guaifenesin (1200mg  extended release tabs such as Mucinex) twice daily, with plenty of water, for cough and congestion.  May add Pseudoephedrine (30mg , one or two every 4 to 6 hours) for sinus congestion.    Try warm salt water gargles for sore throat.  Stop all antihistamines for now, and other non-prescription cough/cold preparations. Continue albuterol inhaler as needed.  Resume Flovent inhaler. Begin Azithromycin if not improving about one week or if persistent fever develops (Given a prescription to hold, with an expiration date)  Follow-up with family doctor if not improving about10 days.     Final Clinical Impressions(s) / UC Diagnoses   Final diagnoses:  Persistent cough for 3 weeks or longer    New Prescriptions New Prescriptions   AZITHROMYCIN (ZITHROMAX Z-PAK) 250 MG TABLET    Take 2 tabs today; then begin one tab once daily  for 4 more days. (Rx void after 05/07/16)   BENZONATATE (TESSALON) 200 MG CAPSULE    Take one cap by mouth at bedtime as needed for cough.  May repeat in 4 to 6 hours   PREDNISONE (DELTASONE) 20 MG TABLET    Take one tab by mouth twice daily for 5 days, then one daily. Take with food.     Kandra Nicolas, MD 05/02/16 1055

## 2016-04-29 NOTE — Discharge Instructions (Signed)
Take plain guaifenesin (1200mg  extended release tabs such as Mucinex) twice daily, with plenty of water, for cough and congestion.  May add Pseudoephedrine (30mg , one or two every 4 to 6 hours) for sinus congestion.    Try warm salt water gargles for sore throat.  Stop all antihistamines for now, and other non-prescription cough/cold preparations. Continue albuterol inhaler as needed.  Resume Flovent inhaler. Begin Azithromycin if not improving about one week or if persistent fever develops   Follow-up with family doctor if not improving about10 days.

## 2016-05-11 ENCOUNTER — Ambulatory Visit (INDEPENDENT_AMBULATORY_CARE_PROVIDER_SITE_OTHER): Payer: Commercial Managed Care - PPO | Admitting: Gynecology

## 2016-05-11 DIAGNOSIS — Z23 Encounter for immunization: Secondary | ICD-10-CM

## 2016-06-25 ENCOUNTER — Telehealth: Payer: Self-pay | Admitting: Physician Assistant

## 2016-06-25 NOTE — Telephone Encounter (Signed)
She needs to return at this time that is a different medicine.  I need to follow up with her, so I would like her to return.

## 2016-06-25 NOTE — Telephone Encounter (Signed)
Pt advised and transferred to front desk for appt

## 2016-06-25 NOTE — Telephone Encounter (Signed)
Pt called to see if English could change her inhaler prescription to Advair.  The other prescription is too expensive and Advair is more affordable for her.  [phone]

## 2016-07-22 ENCOUNTER — Other Ambulatory Visit: Payer: Commercial Managed Care - PPO

## 2016-07-22 DIAGNOSIS — D500 Iron deficiency anemia secondary to blood loss (chronic): Secondary | ICD-10-CM

## 2016-07-22 DIAGNOSIS — R7309 Other abnormal glucose: Secondary | ICD-10-CM

## 2016-07-22 LAB — CBC WITH DIFFERENTIAL/PLATELET
Basophils Absolute: 58 cells/uL (ref 0–200)
Basophils Relative: 1 %
Eosinophils Absolute: 290 cells/uL (ref 15–500)
Eosinophils Relative: 5 %
HCT: 32.6 % — ABNORMAL LOW (ref 35.0–45.0)
Hemoglobin: 10 g/dL — ABNORMAL LOW (ref 11.7–15.5)
LYMPHS ABS: 2784 {cells}/uL (ref 850–3900)
LYMPHS PCT: 48 %
MCH: 23 pg — ABNORMAL LOW (ref 27.0–33.0)
MCHC: 30.7 g/dL — ABNORMAL LOW (ref 32.0–36.0)
MCV: 75.1 fL — ABNORMAL LOW (ref 80.0–100.0)
MPV: 9.1 fL (ref 7.5–12.5)
Monocytes Absolute: 290 cells/uL (ref 200–950)
Monocytes Relative: 5 %
Neutro Abs: 2378 cells/uL (ref 1500–7800)
Neutrophils Relative %: 41 %
Platelets: 445 10*3/uL — ABNORMAL HIGH (ref 140–400)
RBC: 4.34 MIL/uL (ref 3.80–5.10)
RDW: 18 % — AB (ref 11.0–15.0)
WBC: 5.8 10*3/uL (ref 3.8–10.8)

## 2016-07-23 LAB — HEMOGLOBIN A1C
HEMOGLOBIN A1C: 6.4 % — AB (ref <5.7)
MEAN PLASMA GLUCOSE: 137 mg/dL

## 2016-07-23 LAB — GLUCOSE, FASTING: Glucose, Fasting: 102 mg/dL — ABNORMAL HIGH (ref 65–99)

## 2016-09-16 IMAGING — DX DG CHEST 2V
2 series · 2 of 2 positions shown · non-contrast
Comparison: 03/20/2004.

CLINICAL DATA: Productive cough and wheezing for 3 weeks, asthma.

EXAM:
CHEST  2 VIEW

[chest pa]
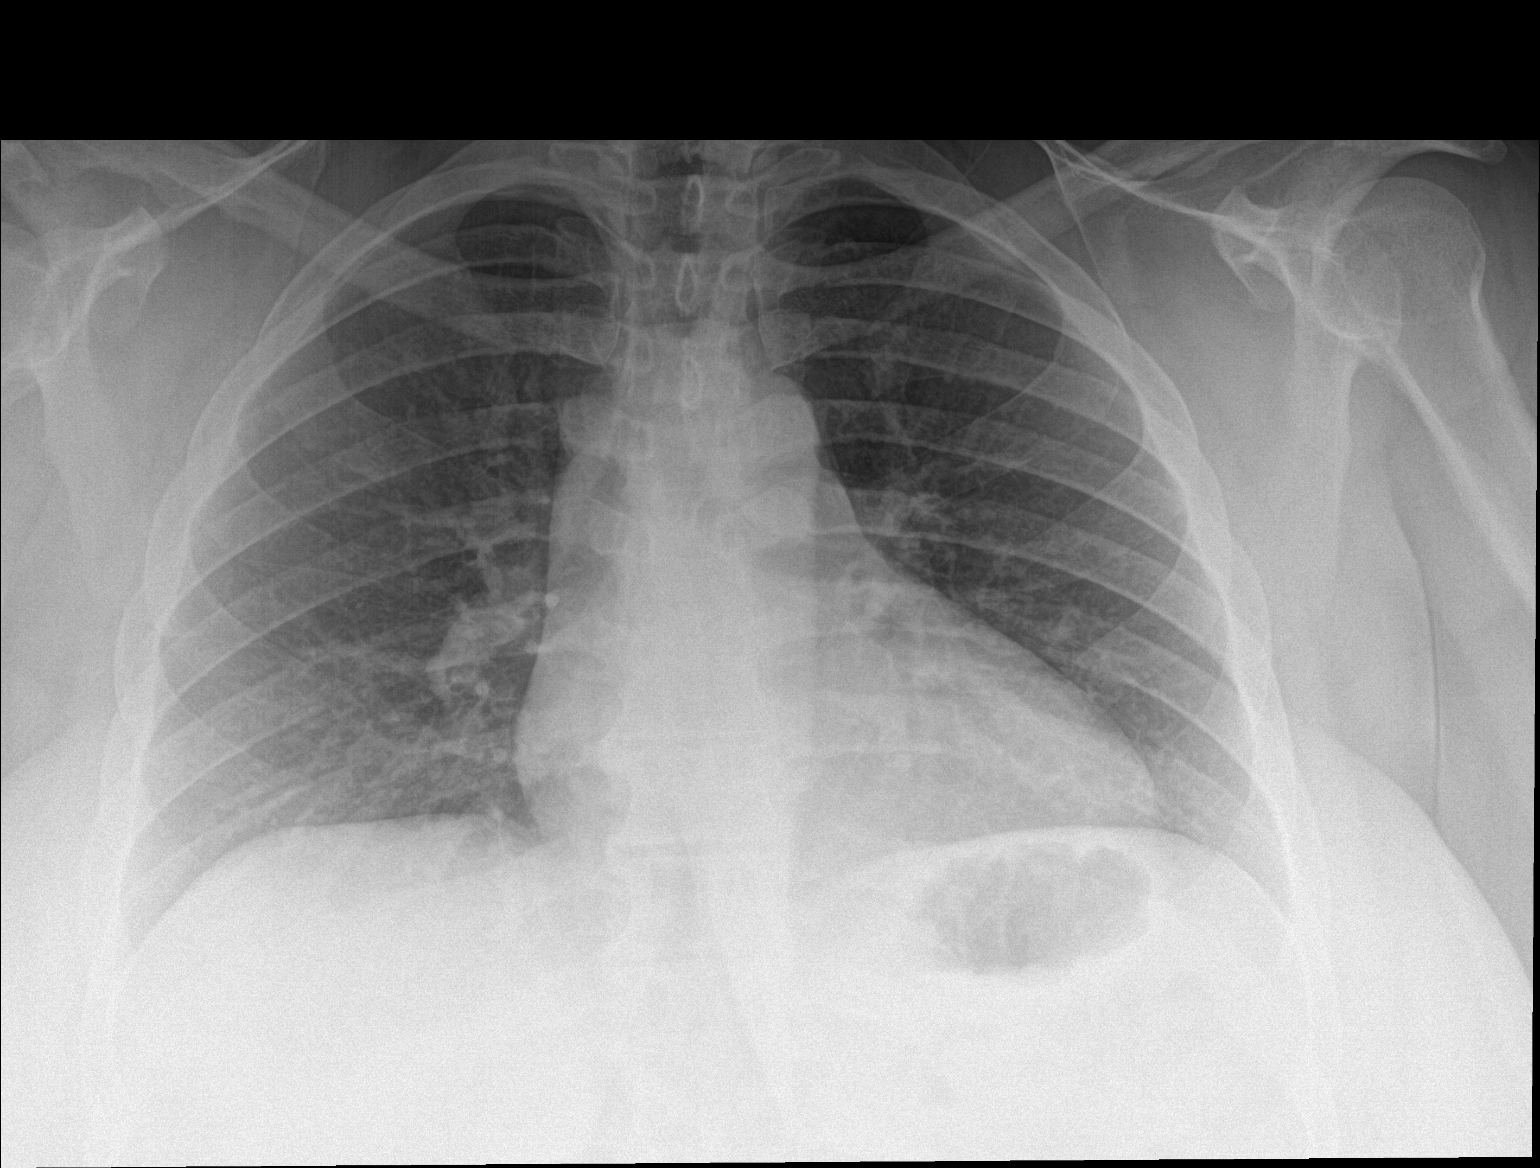

[chest lat]
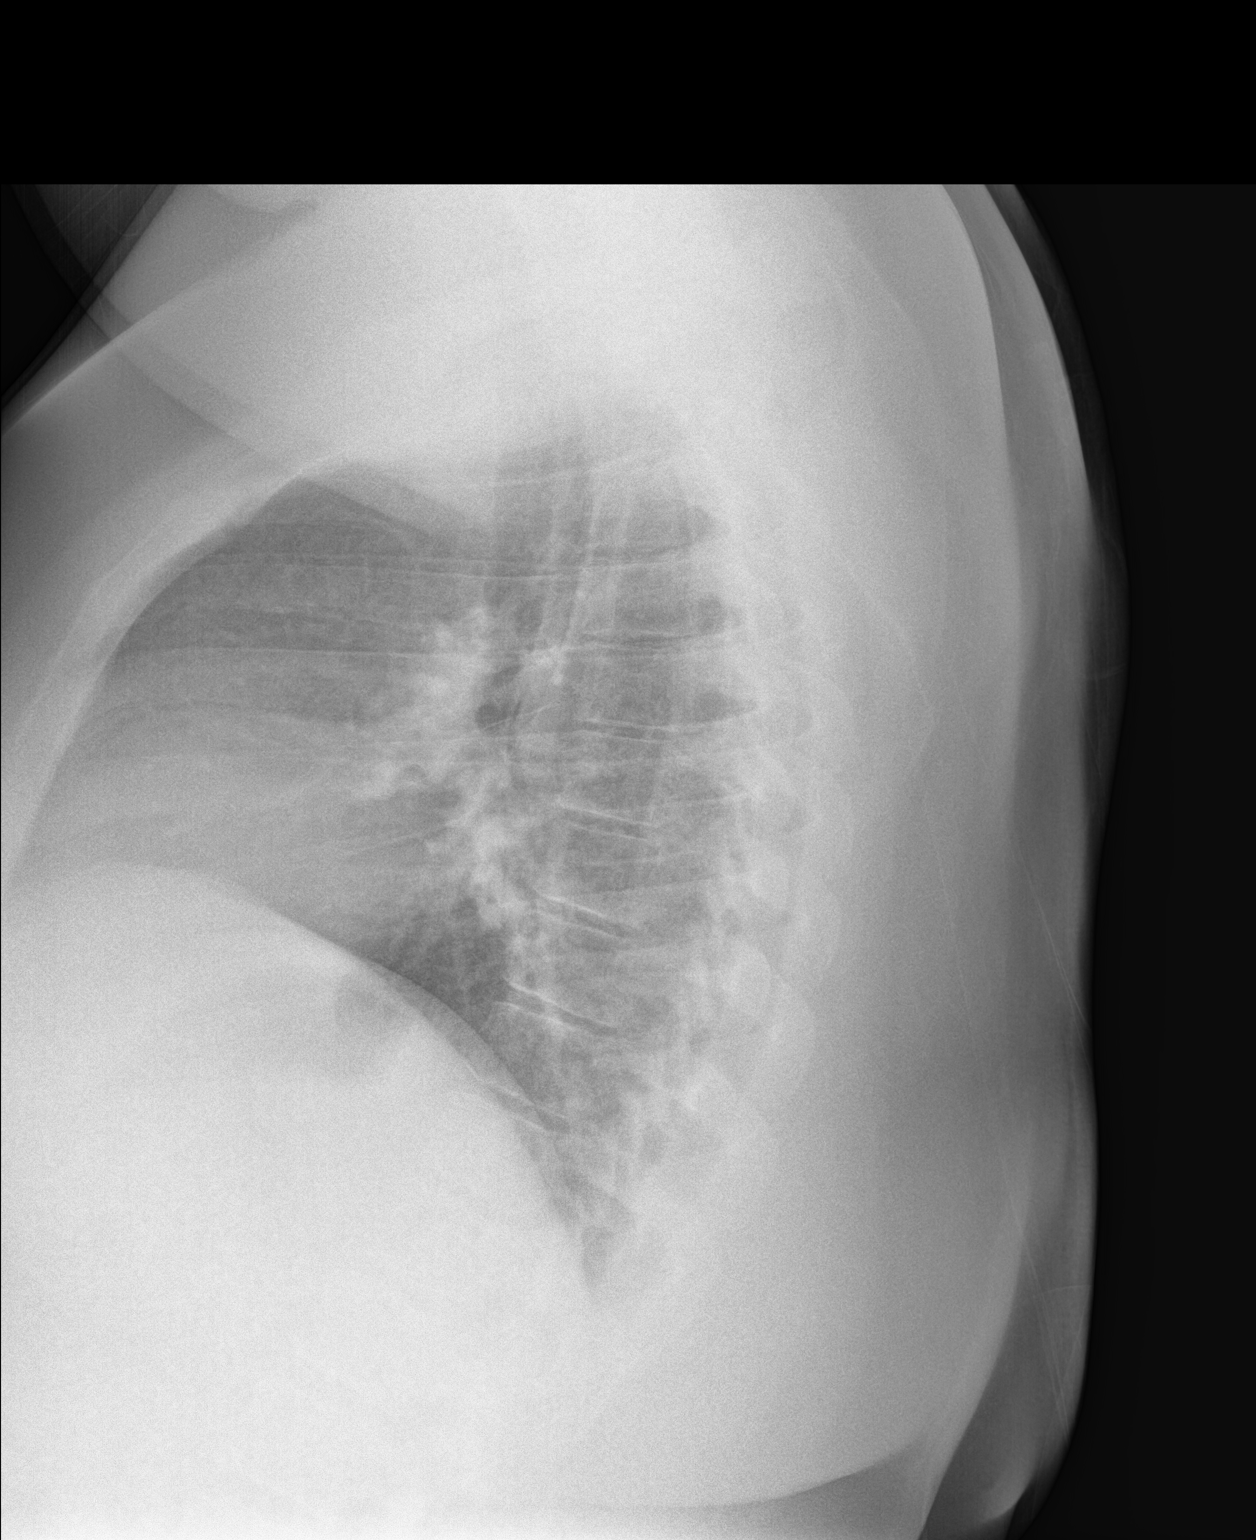

[2 of 2 positions shown; findings below may reference images not displayed]

FINDINGS: Trachea is midline. Heart size normal. Lungs are somewhat low in
volume but clear. No pleural fluid.
IMPRESSION: No acute findings.

## 2017-01-26 ENCOUNTER — Telehealth: Payer: Self-pay | Admitting: Physician Assistant

## 2017-01-26 ENCOUNTER — Encounter: Payer: Self-pay | Admitting: Physician Assistant

## 2017-01-26 ENCOUNTER — Ambulatory Visit (INDEPENDENT_AMBULATORY_CARE_PROVIDER_SITE_OTHER): Payer: Commercial Managed Care - PPO | Admitting: Physician Assistant

## 2017-01-26 VITALS — BP 140/90 | HR 93 | Temp 97.9°F | Resp 18 | Ht 68.0 in | Wt 333.4 lb

## 2017-01-26 DIAGNOSIS — J302 Other seasonal allergic rhinitis: Secondary | ICD-10-CM

## 2017-01-26 DIAGNOSIS — J4520 Mild intermittent asthma, uncomplicated: Secondary | ICD-10-CM | POA: Diagnosis not present

## 2017-01-26 MED ORDER — BUDESONIDE 180 MCG/ACT IN AEPB: 1.0000 | INHALATION_SPRAY | Freq: Two times a day (BID) | RESPIRATORY_TRACT | 11 refills | Status: DC

## 2017-01-26 MED ORDER — FLUTICASONE PROPIONATE 50 MCG/ACT NA SUSP: 2.0000 | Freq: Every day | NASAL | 12 refills | Status: DC

## 2017-01-26 MED ORDER — CETIRIZINE HCL 10 MG PO TABS: 10.0000 mg | ORAL_TABLET | Freq: Every day | ORAL | 11 refills | Status: DC

## 2017-01-26 MED ORDER — PREDNISONE 20 MG PO TABS: 40.0000 mg | ORAL_TABLET | Freq: Every day | ORAL | 0 refills | Status: DC

## 2017-01-26 MED ORDER — BUDESONIDE-FORMOTEROL FUMARATE 80-4.5 MCG/ACT IN AERO: 2.0000 | INHALATION_SPRAY | Freq: Two times a day (BID) | RESPIRATORY_TRACT | 3 refills | Status: DC

## 2017-01-26 NOTE — Telephone Encounter (Signed)
Pt was just seen by stephanie english and was told to call if the inhaler is too expensive which it is and she needs something else called in -PT IS AT [place] number [phone]

## 2017-01-26 NOTE — Patient Instructions (Addendum)
Allergies An allergy is when your body reacts to a substance in a way that is not normal. An allergic reaction can happen after you:  Eat something.  Breathe in something.  Touch something.  You can be allergic to:  Things that are only around during certain seasons, like molds and pollens.  Foods.  Drugs.  Insects.  Animal dander.  What are the signs or symptoms?  Puffiness (swelling). This may happen on the lips, face, tongue, mouth, or throat.  Sneezing.  Coughing.  Breathing loudly (wheezing).  Stuffy nose.  Tingling in the mouth.  A rash.  Itching.  Itchy, red, puffy areas of skin (hives).  Watery eyes.  Throwing up (vomiting).  Watery poop (diarrhea).  Dizziness.  Feeling faint or fainting.  Trouble breathing or swallowing.  A tight feeling in the chest.  A fast heartbeat. How is this diagnosed? Allergies can be diagnosed with:  A medical and family history.  Skin tests.  Blood tests.  A food diary. A food diary is a record of all the foods, drinks, and symptoms you have each day.  The results of an elimination diet. This diet involves making sure not to eat certain foods and then seeing what happens when you start eating them again.  How is this treated? There is no cure for allergies, but allergic reactions can be treated with medicine. Severe reactions usually need to be treated at a hospital. How is this prevented? The best way to prevent an allergic reaction is to avoid the thing you are allergic to. Allergy shots and medicines can also help prevent reactions in some cases. This information is not intended to replace advice given to you by your health care provider. Make sure you discuss any questions you have with your health care provider. Document Released: 07/17/2012 Document Revised: 11/17/2015 Document Reviewed: 01/01/2014 Elsevier Interactive Patient Education  2018 [employer].   IF you received an x-ray today, you  will receive an invoice from [facility]. Please contact [facility] at [phone] with questions or concerns regarding your invoice.   IF you received labwork today, you will receive an invoice from [employer]. Please contact [facility] at [phone] with questions or concerns regarding your invoice.   Our billing staff will not be able to assist you with questions regarding bills from these companies.  You will be contacted with the lab results as soon as they are available. The fastest way to get your results is to activate your My Chart account. Instructions are located on the last page of this paperwork. If you have not heard from [place] regarding the results in 2 weeks, please contact this office.

## 2017-01-26 NOTE — Progress Notes (Signed)
PRIMARY CARE AT [facility] [address] [place] [address] [phone]  Date:  01/26/2017   Name:  Alexandra Morgan   DOB:  Aug 17, 1980   MRN:  [id number]  PCP:  Harrison Mons, PA-C    History of Present Illness:  Alexandra Morgan is a 36 y.o. female patient who presents to PCP with  Chief Complaint  Patient presents with  . URI    x4 weeks  . Cough    off/on producing sputum; has lots of congestion     4 weeks ago, started with congestion, and coughing.  She is having back and forth with headache, eyes itching, sneezing, water eytes.  Cough is productive intermittently.  No fever.  No malaise.  She is not using the flovent due to cost.  She is not using her albuterol, but once or twice in the last 4 weeks.  It makes her jittery.    There are no active problems to display for this patient.   Past Medical History:  Diagnosis Date  . Asthma   . Chlamydia 05/2014  . Fibroid   . HPV test positive 04/2014, 04/2015   normal cytology, negative subtype 16, 18/45. Recommend repeat Pap smear/HPV one year  . Migraines     Past Surgical History:  Procedure Laterality Date  . ABDOMINAL SURGERY  05/10   ABDOMINAL MYOMECTOMY Dr Elby Showers  . DENTAL SURGERY    . INCISION AND DRAINAGE BREAST ABSCESS      Social History  Substance Use Topics  . Smoking status: Never Smoker  . Smokeless tobacco: Never Used  . Alcohol use 0.0 oz/week     Comment: Rare    Family History  Problem Relation Age of Onset  . Heart disease Father   . Hypertension Father   . Diabetes Father   . Stroke Father   . Hypertension Sister   . Diabetes Brother   . Hypertension Brother   . Cancer Mother        Bone  . Stroke Maternal Grandfather     No Known Allergies  Medication list has been reviewed and updated.  Current Outpatient Prescriptions on File Prior to Visit  Medication Sig Dispense Refill  . albuterol (PROVENTIL HFA;VENTOLIN HFA) 108 (90 Base) MCG/ACT inhaler Inhale 2 puffs into the lungs every  4 (four) hours as needed for wheezing or shortness of breath. 6.7 each 5  . fluticasone (FLONASE) 50 MCG/ACT nasal spray Place 2 sprays into both nostrils daily. 16 g 12  . folic acid (FOLVITE) 154 MCG tablet Take 400 mcg by mouth daily.    Marland Kitchen ibuprofen (ADVIL,MOTRIN) 800 MG tablet Take 1 tablet (800 mg total) by mouth every 8 (eight) hours as needed. 90 tablet 1  . Lactobacillus (ACIDOPHILUS PO) Take 1 tablet by mouth daily.    Marland Kitchen loratadine (CLARITIN) 10 MG tablet Take 1 tablet (10 mg total) by mouth daily. 30 tablet 11  . Multiple Vitamins-Minerals (MULTIVITAMIN WITH MINERALS) tablet Take 1 tablet by mouth daily.    . fluticasone (FLOVENT HFA) 110 MCG/ACT inhaler Inhale 1 puff into the lungs 2 (two) times daily. (Patient not taking: Reported on 01/26/2017) 1 Inhaler 10   No current facility-administered medications on file prior to visit.     ROS ROS otherwise unremarkable unless listed above.  Physical Examination: BP 140/90   Pulse 93   Temp 97.9 F (36.6 C) (Oral)   Resp 18   Ht 5\' 8"  (1.727 m)   Wt (!) 333 lb 6.4 oz (  151.2 kg)   SpO2 99%   BMI 50.69 kg/m  Ideal Body Weight: Weight in (lb) to have BMI = 25: 164.1  Physical Exam  Constitutional: She is oriented to person, place, and time. She appears well-developed and well-nourished. No distress.  HENT:  Head: Normocephalic and atraumatic.  Right Ear: Tympanic membrane, external ear and ear canal normal.  Left Ear: Tympanic membrane, external ear and ear canal normal.  Nose: Mucosal edema (pallor of nasal turbinates.) present. No rhinorrhea. Right sinus exhibits no maxillary sinus tenderness and no frontal sinus tenderness. Left sinus exhibits no maxillary sinus tenderness and no frontal sinus tenderness.  Mouth/Throat: No uvula swelling. No oropharyngeal exudate, posterior oropharyngeal edema or posterior oropharyngeal erythema.  Eyes: Pupils are equal, round, and reactive to light. Conjunctivae and EOM are normal.  No  cobblestoning  Cardiovascular: Normal rate, regular rhythm and normal heart sounds.  Exam reveals no gallop, no distant heart sounds and no friction rub.   No murmur heard. Pulmonary/Chest: Effort normal. No respiratory distress. She has no decreased breath sounds. She has wheezes (very minimal expiratory wheezing). She has no rhonchi.  Lymphadenopathy:       Head (right side): No submandibular, no tonsillar, no preauricular and no posterior auricular adenopathy present.       Head (left side): No submandibular, no tonsillar, no preauricular and no posterior auricular adenopathy present.  Neurological: She is alert and oriented to person, place, and time.  Skin: She is not diaphoretic.  Psychiatric: She has a normal mood and affect. Her behavior is normal.     Assessment and Plan: [name] is a 36 y.o. female who is here today for  Chief Complaint  Patient presents with  . URI    x4 weeks  . Cough    off/on producing sputum; has lots of congestion   This appears to be the result of untreated allergies, where asthma is triggered. Discussed the importance of using an allergy medication at least during this season trigger.  Educated on the importance of keeping a maintanenance inhaler as preventative of exacerbation and complication to infection.  She voiced understanding.  Concerned of the possibility of heart disease from the laba inhaler.  We will try steroid inhaler, but due to cost, advised to contact if this is not able to be obtained.  Mild intermittent asthma, unspecified whether complicated - Plan: budesonide (PULMICORT) 180 MCG/ACT inhaler, cetirizine (ZYRTEC) 10 MG tablet, fluticasone (FLONASE) 50 MCG/ACT nasal spray, predniSONE (DELTASONE) 20 MG tablet, budesonide-formoterol (SYMBICORT) 80-4.5 MCG/ACT inhaler  Seasonal allergies  Ivar Drape, PA-C [facility] 10/27/20188:46 AM  Addendum: changed to pulmicort due to  cost.

## 2017-01-27 NOTE — Telephone Encounter (Signed)
I SENT IN THE SYMBICORT, DUE TO THE EXPENSE OF THE PULMICORT.

## 2017-01-27 NOTE — Telephone Encounter (Signed)
Please advise 

## 2017-01-27 NOTE — Telephone Encounter (Signed)
Spoke with pt this Morning and she informed me that she has picked up Rx and will give [place] a call if she has anymore concerns.

## 2017-04-27 ENCOUNTER — Encounter: Payer: Self-pay | Admitting: Gynecology

## 2017-04-27 ENCOUNTER — Ambulatory Visit (INDEPENDENT_AMBULATORY_CARE_PROVIDER_SITE_OTHER): Payer: Commercial Managed Care - PPO | Admitting: Gynecology

## 2017-04-27 VITALS — BP 124/80 | Ht 68.0 in | Wt 328.0 lb

## 2017-04-27 DIAGNOSIS — D259 Leiomyoma of uterus, unspecified: Secondary | ICD-10-CM

## 2017-04-27 DIAGNOSIS — D649 Anemia, unspecified: Secondary | ICD-10-CM | POA: Diagnosis not present

## 2017-04-27 DIAGNOSIS — Z1151 Encounter for screening for human papillomavirus (HPV): Secondary | ICD-10-CM

## 2017-04-27 DIAGNOSIS — L918 Other hypertrophic disorders of the skin: Secondary | ICD-10-CM

## 2017-04-27 DIAGNOSIS — Z13220 Encounter for screening for lipoid disorders: Secondary | ICD-10-CM

## 2017-04-27 DIAGNOSIS — Z113 Encounter for screening for infections with a predominantly sexual mode of transmission: Secondary | ICD-10-CM

## 2017-04-27 DIAGNOSIS — Z01411 Encounter for gynecological examination (general) (routine) with abnormal findings: Secondary | ICD-10-CM | POA: Diagnosis not present

## 2017-04-27 NOTE — Progress Notes (Signed)
    Alexandra Morgan 1980/07/10 [id number]        37 y.o.  G1P0010 for annual gynecologic exam.  Doing well from a gynecologic standpoint.  History of leiomyoma with borderline anemia on extra iron.  History of positive HPV in the past although Pap smear last year was normal with negative HPV.  Past medical history,surgical history, problem list, medications, allergies, family history and social history were all reviewed and documented as reviewed in the EPIC chart.  ROS:  Performed with pertinent positives and negatives included in the history, assessment and plan.   Additional significant findings : None   Exam: [name] assistant Vitals:   04/27/17 0900  BP: 124/80  Weight: (!) 328 lb (148.8 kg)  Height: 5\' 8"  (1.727 m)   Body mass index is 49.87 kg/m.  General appearance:  Normal affect, orientation and appearance. Skin: Grossly normal.  Classic appearing skin tag right hip area HEENT: Without gross lesions.  No cervical or supraclavicular adenopathy. Thyroid normal.  Lungs:  Clear without wheezing, rales or rhonchi Cardiac: RR, without RMG Abdominal:  Soft, nontender, without masses, guarding, rebound, organomegaly or hernia Breasts:  Examined lying and sitting without masses, retractions, discharge or axillary adenopathy. Pelvic:  Ext, BUS, Vagina: Normal  Cervix: Normal.  Pap smear/HPV.  GC/Chlamydia  Uterus: Difficult to palpate.  No gross masses or tenderness  Adnexa: Without gross masses or tenderness    Anus and perineum: Normal   Rectovaginal: Normal sphincter tone without palpated masses or tenderness.    Assessment/Plan:  37 y.o. G81P0010 female for annual gynecologic exam with regular menses, not currently sexually active.   1. Contraception.  I again reviewed contraception with her.  Currently not sexually active.  Is thinking about childbearing at some point and is not interested in contraception at this time. 2. History of leiomyoma.  Exam is difficult due to  abdominal girth.  Menses have remained stable and monthly with no intermenstrual bleeding.  We discussed options for repeat ultrasound for surveillance.  At this point the patient is comfortable following expectantly.  Check hemoglobin today.  She is on extra iron due to her history of anemia. 3. Pap smear/HPV negative last year.  History of normal cytology with positive high risk HPV 2017.  History of normal cytology with positive HPV negative subtypes 16, 18/45 2016.  Colposcopy 2017 was negative with ECC showing koilocytotic atypia.  Pap smear/HPV today. 4. History of chlamydia 2016.  Patient requests GC/Chlamydia screen. 5. Breast health.  SBE monthly reviewed.  Plan baseline mammogram at age 9. 39. Skin tag right hip.  Is been present for years.  Classic in appearance.  Gets caught on clothing and patient wants removed.  Will make appointment for excision. 7. Health maintenance.  Requests baseline labs.  CBC, CMP, hemoglobin A1c, lipid profile, GC/chlamydia.  Follow-up 1 year, sooner as needed.   [name] MD, 9:22 AM 04/27/2017

## 2017-04-27 NOTE — Addendum Note (Signed)
Addended by: Nelva Nay on: 04/27/2017 09:36 AM   Modules accepted: Orders

## 2017-04-27 NOTE — Patient Instructions (Signed)
Follow up for skin biopsy if you desire.  Follow-up in 1 year for annual exam.

## 2017-04-28 ENCOUNTER — Telehealth: Payer: Self-pay | Admitting: *Deleted

## 2017-04-28 DIAGNOSIS — M62838 Other muscle spasm: Secondary | ICD-10-CM

## 2017-04-28 LAB — C. TRACHOMATIS/N. GONORRHOEAE RNA
C. trachomatis RNA, TMA: NOT DETECTED
N. gonorrhoeae RNA, TMA: NOT DETECTED

## 2017-04-28 LAB — CBC WITH DIFFERENTIAL/PLATELET
BASOS PCT: 0.5 %
Basophils Absolute: 31 cells/uL (ref 0–200)
Eosinophils Absolute: 311 cells/uL (ref 15–500)
Eosinophils Relative: 5.1 %
HEMATOCRIT: 33.4 % — AB (ref 35.0–45.0)
HEMOGLOBIN: 10 g/dL — AB (ref 11.7–15.5)
LYMPHS ABS: 2782 {cells}/uL (ref 850–3900)
MCH: 22.2 pg — ABNORMAL LOW (ref 27.0–33.0)
MCHC: 29.9 g/dL — ABNORMAL LOW (ref 32.0–36.0)
MCV: 74.2 fL — AB (ref 80.0–100.0)
MPV: 9.8 fL (ref 7.5–12.5)
Monocytes Relative: 4.5 %
NEUTROS ABS: 2702 {cells}/uL (ref 1500–7800)
NEUTROS PCT: 44.3 %
Platelets: 407 10*3/uL — ABNORMAL HIGH (ref 140–400)
RBC: 4.5 10*6/uL (ref 3.80–5.10)
RDW: 17.9 % — AB (ref 11.0–15.0)
Total Lymphocyte: 45.6 %
WBC: 6.1 10*3/uL (ref 3.8–10.8)
WBCMIX: 275 {cells}/uL (ref 200–950)

## 2017-04-28 LAB — COMPREHENSIVE METABOLIC PANEL
AG Ratio: 1.2 (calc) (ref 1.0–2.5)
ALBUMIN MSPROF: 3.7 g/dL (ref 3.6–5.1)
ALKALINE PHOSPHATASE (APISO): 88 U/L (ref 33–115)
ALT: 12 U/L (ref 6–29)
AST: 13 U/L (ref 10–30)
BUN: 8 mg/dL (ref 7–25)
CHLORIDE: 109 mmol/L (ref 98–110)
CO2: 22 mmol/L (ref 20–32)
CREATININE: 0.68 mg/dL (ref 0.50–1.10)
Calcium: 9.1 mg/dL (ref 8.6–10.2)
GLOBULIN: 3.2 g/dL (ref 1.9–3.7)
Glucose, Bld: 106 mg/dL — ABNORMAL HIGH (ref 65–99)
POTASSIUM: 4.5 mmol/L (ref 3.5–5.3)
SODIUM: 140 mmol/L (ref 135–146)
TOTAL PROTEIN: 6.9 g/dL (ref 6.1–8.1)
Total Bilirubin: 0.2 mg/dL (ref 0.2–1.2)

## 2017-04-28 LAB — HEMOGLOBIN A1C
Hgb A1c MFr Bld: 6.4 % of total Hgb — ABNORMAL HIGH (ref <5.7)
Mean Plasma Glucose: 137 (calc)
eAG (mmol/L): 7.6 (calc)

## 2017-04-28 LAB — LIPID PANEL
CHOL/HDL RATIO: 2.8 (calc) (ref <5.0)
Cholesterol: 166 mg/dL (ref <200)
HDL: 59 mg/dL (ref >50)
LDL CHOLESTEROL (CALC): 93 mg/dL
Non-HDL Cholesterol (Calc): 107 mg/dL (calc) (ref <130)
Triglycerides: 57 mg/dL (ref <150)

## 2017-04-28 LAB — TSH: TSH: 1.47 mIU/L

## 2017-04-28 MED ORDER — IBUPROFEN 800 MG PO TABS: ORAL_TABLET | ORAL | 2 refills | Status: DC

## 2017-04-28 NOTE — Telephone Encounter (Signed)
Pt aware, Rx sent. 

## 2017-04-28 NOTE — Telephone Encounter (Signed)
Patient had annual exam yesterday and forgot to ask if you will refill Motrin 800 mg tablets she takes for cramps during cycles.  Please advise

## 2017-04-28 NOTE — Telephone Encounter (Signed)
Okay for ibuprofen 800 mg #40 1 p.o. every 8 hours as needed cramping 2 refills

## 2017-04-29 LAB — PAP IG AND HPV HIGH-RISK: HPV DNA HIGH RISK: NOT DETECTED

## 2017-05-03 ENCOUNTER — Telehealth: Payer: Self-pay | Admitting: *Deleted

## 2017-05-03 NOTE — Telephone Encounter (Signed)
Patient called left message in voicemail asking for pap and urine results. I called back and received her voicemail, per DPR access I left a detailed message on voicemail pap normal and urine was not order at the visit.

## 2017-05-17 ENCOUNTER — Ambulatory Visit (INDEPENDENT_AMBULATORY_CARE_PROVIDER_SITE_OTHER): Payer: Commercial Managed Care - PPO | Admitting: Gynecology

## 2017-05-17 ENCOUNTER — Encounter: Payer: Self-pay | Admitting: Gynecology

## 2017-05-17 VITALS — BP 124/80

## 2017-05-17 DIAGNOSIS — L918 Other hypertrophic disorders of the skin: Secondary | ICD-10-CM

## 2017-05-17 DIAGNOSIS — L919 Hypertrophic disorder of the skin, unspecified: Secondary | ICD-10-CM | POA: Diagnosis not present

## 2017-05-17 NOTE — Progress Notes (Signed)
    Alexandra Morgan 1980-06-05 [id number]        37 y.o.  G1P0010 presents for removal of a skin tag.  It is bothersome to her getting caught on clothing.  Past medical history,surgical history, problem list, medications, allergies, family history and social history were all reviewed and documented in the EPIC chart.  Directed ROS with pertinent positives and negatives documented in the history of present illness/assessment and plan.  Exam: [name] assistant Vitals:   05/17/17 1615  BP: 124/80   General appearance:  Normal Classic appearing skin tag right flank area above hip. Physical Exam  Constitutional:    Procedure: The skin overlying and surrounding the skin tag was cleansed with Betadine.  The base of the skin tag was infiltrated with 1% lidocaine and the skin  tag was excised at the level of the surrounding skin.  It was sent to pathology at her choice.  Silver nitrate hemostasis applied afterwards as well as sterile dressing.  Postop instructions given.  Assessment/Plan:  37 y.o. G1P0010 bothersome skin tag excised.  Sent to pathology at her request.  Patient will follow-up for pathology results.  She will follow-up if any issues with the healing.    [name] MD, 4:43 PM 05/17/2017

## 2017-05-17 NOTE — Patient Instructions (Signed)
Office will call you with biopsy results 

## 2017-05-17 NOTE — Addendum Note (Signed)
Addended by: [name] on: 05/17/2017 05:01 PM   Modules accepted: Orders

## 2017-05-18 ENCOUNTER — Encounter: Payer: Self-pay | Admitting: Gynecology

## 2017-05-19 LAB — TISSUE SPECIMEN

## 2017-05-19 LAB — PATHOLOGY

## 2017-07-07 ENCOUNTER — Encounter: Payer: Self-pay | Admitting: Physician Assistant

## 2017-07-12 ENCOUNTER — Other Ambulatory Visit: Payer: Self-pay

## 2017-07-12 ENCOUNTER — Emergency Department (INDEPENDENT_AMBULATORY_CARE_PROVIDER_SITE_OTHER)
Admission: EM | Admit: 2017-07-12 | Discharge: 2017-07-12 | Disposition: A | Discharge status: Home / Self Care | Payer: Commercial Managed Care - PPO | Source: Home / Self Care | Attending: Family Medicine | Admitting: Family Medicine

## 2017-07-12 ENCOUNTER — Encounter: Payer: Self-pay | Admitting: *Deleted

## 2017-07-12 DIAGNOSIS — J069 Acute upper respiratory infection, unspecified: Secondary | ICD-10-CM | POA: Diagnosis not present

## 2017-07-12 DIAGNOSIS — B9789 Other viral agents as the cause of diseases classified elsewhere: Secondary | ICD-10-CM | POA: Diagnosis not present

## 2017-07-12 MED ORDER — PREDNISONE 20 MG PO TABS: ORAL_TABLET | ORAL | 0 refills | Status: DC

## 2017-07-12 NOTE — Discharge Instructions (Signed)
If increasing cold symptoms develop, try the following: Begin prednisone. Take plain guaifenesin (1200mg  extended release tabs such as Mucinex) twice daily, with plenty of water, for cough and congestion.  May add Pseudoephedrine (30mg , one or two every 4 to 6 hours) for sinus congestion.  Get adequate rest.   May use Afrin nasal spray (or generic oxymetazoline) each morning for about 5 days and then discontinue.  Also recommend using saline nasal spray several times daily and saline nasal irrigation (AYR is a common brand).  Use Flonase nasal spray each morning after using Afrin nasal spray and saline nasal irrigation. Try warm salt water gargles for sore throat.  Stop all antihistamines for now, and other non-prescription cough/cold preparations. May take Delsym Cough Suppressant at bedtime for nighttime cough.  Continue albuterol inhaler and Symbicort as prescribed.

## 2017-07-12 NOTE — ED Triage Notes (Signed)
Pt c/o fatigue, runny nose, HA and minimal cough x 1 day. Family with flu. Temp 99.0. Last dose IBF 12N today.

## 2017-07-12 NOTE — ED Provider Notes (Signed)
[name]    CSN: [id number] Arrival date & time: 07/12/17  1822     History   Chief Complaint Chief Complaint  Patient presents with  . Fatigue  . Nasal Congestion    HPI Alexandra Morgan is a 37 y.o. female.   Patient developed nasal congestion two days ago but did not feel ill.  Yesterday she developed mild fatigue, minimal cough, headache, and mild myalgias.  She has had a low grade fever and chills.  She has asthma but denies wheezing or shortness of breath.  She concerned about possible flu:  Her mother had flu last week, and now a niece and nephew.  The history is provided by the patient.    Past Medical History:  Diagnosis Date  . Asthma   . Chlamydia 05/2014  . Fibroid   . HPV test positive 04/2014, 04/2015   normal cytology, negative subtype 16, 18/45. Recommend repeat Pap smear/HPV one year  . Migraines     There are no active problems to display for this patient.   Past Surgical History:  Procedure Laterality Date  . ABDOMINAL SURGERY  05/10   ABDOMINAL MYOMECTOMY Dr Elby Showers  . DENTAL SURGERY    . INCISION AND DRAINAGE BREAST ABSCESS      OB History    Gravida  1   Para      Term      Preterm      AB  1   Living  0     SAB      TAB  1   Ectopic      Multiple      Live Births               Home Medications    Prior to Admission medications   Medication Sig Start Date End Date Taking? Authorizing Provider  fluticasone (FLONASE) 50 MCG/ACT nasal spray Place 2 sprays into both nostrils daily. 01/26/17  Yes English, Colletta Maryland D, PA  loratadine (CLARITIN) 10 MG tablet Take 1 tablet (10 mg total) by mouth daily. 03/15/15  Yes English, Colletta Maryland D, PA  albuterol (PROVENTIL HFA;VENTOLIN HFA) 108 (90 Base) MCG/ACT inhaler Inhale 2 puffs into the lungs every 4 (four) hours as needed for wheezing or shortness of breath. 12/18/15   Ivar Drape D, PA  budesonide-formoterol (SYMBICORT) 80-4.5 MCG/ACT inhaler Inhale 2 puffs  into the lungs 2 (two) times daily. 01/26/17   Ivar Drape D, PA  cetirizine (ZYRTEC) 10 MG tablet Take 1 tablet (10 mg total) by mouth daily. 01/26/17   Ivar Drape D, PA  folic acid (FOLVITE) 132 MCG tablet Take 400 mcg by mouth daily.    [provider]  ibuprofen (ADVIL,MOTRIN) 800 MG tablet Take one tablet by mouth every 8 hours as needed for cramping. 04/28/17   Fontaine, Belinda Block, MD  Lactobacillus (ACIDOPHILUS PO) Take 1 tablet by mouth daily.    [provider]  Multiple Vitamins-Minerals (MULTIVITAMIN WITH MINERALS) tablet Take 1 tablet by mouth daily.    [provider]  predniSONE (DELTASONE) 20 MG tablet Take one tab by mouth twice daily for 4 days, then one daily for 3 days. Take with food. 07/12/17   Kandra Nicolas, MD    Family History Family History  Problem Relation Age of Onset  . Heart disease Father   . Hypertension Father   . Diabetes Father   . Stroke Father   . Hypertension Sister   . Diabetes Brother   .  Hypertension Brother   . Cancer Mother        Bone  . Stroke Maternal Grandfather     Social History Social History   Tobacco Use  . Smoking status: Never Smoker  . Smokeless tobacco: Never Used  Substance Use Topics  . Alcohol use: Yes    Alcohol/week: 0.0 oz    Comment: Rare  . Drug use: No     Allergies   Patient has no known allergies.   Review of Systems Review of Systems No sore throat + mild cough No pleuritic pain No wheezing + nasal congestion No post-nasal drainage No sinus pain/pressure No itchy/red eyes No earache No hemoptysis No SOB + fever, + chills No nausea No vomiting No abdominal pain No diarrhea No urinary symptoms No skin rash + fatigue + mild myalgias + headache    Physical Exam Triage Vital Signs ED Triage Vitals  Enc Vitals Group     BP 07/12/17 1910 (!) 145/94     Pulse Rate 07/12/17 1910 83     Resp 07/12/17 1910 18     Temp 07/12/17 1910 98.3 F (36.8  C)     Temp Source 07/12/17 1910 Oral     SpO2 07/12/17 1910 98 %     Weight 07/12/17 1911 (!) 330 lb (149.7 kg)     Height 07/12/17 1911 5\' 8"  (1.727 m)     Head Circumference --      Peak Flow --      Pain Score 07/12/17 1910 0     Pain Loc --      Pain Edu? --      Excl. in [place]? --    No data found.  Updated Vital Signs BP (!) 145/94 (BP Location: Right Arm)   Pulse 83   Temp 98.3 F (36.8 C) (Oral)   Resp 18   Ht 5\' 8"  (1.727 m)   Wt (!) 330 lb (149.7 kg)   LMP 06/26/2017   SpO2 98%   BMI 50.18 kg/m   Visual Acuity Right Eye Distance:   Left Eye Distance:   Bilateral Distance:    Right Eye Near:   Left Eye Near:    Bilateral Near:     Physical Exam Nursing notes and Vital Signs reviewed. Appearance:  Patient appears stated age, and in no acute distress Eyes:  Pupils are equal, round, and reactive to light and accomodation.  Extraocular movement is intact.  Conjunctivae are not inflamed  Ears:  Canals normal.  Tympanic membranes normal.  Nose:  Mildly congested turbinates.  No sinus tenderness.   Pharynx:  Normal Neck:  Supple.  Enlarged posterior/lateral nodes are palpated bilaterally, tender to palpation on the left.   Lungs:  Clear to auscultation.  Breath sounds are equal.  Moving air well. Heart:  Regular rate and rhythm without murmurs, rubs, or gallops.  Abdomen:  Nontender without masses or hepatosplenomegaly.  Bowel sounds are present.  No CVA or flank tenderness.  Extremities:  No edema.  Skin:  No rash present.    UC Treatments / Results  Labs (all labs ordered are listed, but only abnormal results are displayed) Labs Reviewed - No data to display  EKG None Radiology No results found.  Procedures Procedures (including critical care time)  Medications Ordered in UC Medications - No data to display   Initial Impression / Assessment and Plan / UC Course  I have reviewed the triage vital signs and the nursing notes.  Pertinent labs &  imaging results that were available during my care of the patient were reviewed by me and considered in my medical decision making (see chart for details).    Suspect early viral URI There is no evidence of bacterial infection today.   Because of her history of asthma, given an Rx for prednisone to hold. If increasing cold symptoms develop, try the following: Begin prednisone. Take plain guaifenesin (1200mg  extended release tabs such as Mucinex) twice daily, with plenty of water, for cough and congestion.  May add Pseudoephedrine (30mg , one or two every 4 to 6 hours) for sinus congestion.  Get adequate rest.   May use Afrin nasal spray (or generic oxymetazoline) each morning for about 5 days and then discontinue.  Also recommend using saline nasal spray several times daily and saline nasal irrigation (AYR is a common brand).  Use Flonase nasal spray each morning after using Afrin nasal spray and saline nasal irrigation. Try warm salt water gargles for sore throat.  Stop all antihistamines for now, and other non-prescription cough/cold preparations. May take Delsym Cough Suppressant at bedtime for nighttime cough.  Continue albuterol inhaler and Symbicort as prescribed. Followup with Family Doctor if not improved in 7 to 10 days.   Final Clinical Impressions(s) / UC Diagnoses   Final diagnoses:  Viral URI with cough    ED Discharge Orders        Ordered    predniSONE (DELTASONE) 20 MG tablet     07/12/17 2005         Kandra Nicolas, MD 07/13/17 1232

## 2017-07-13 ENCOUNTER — Encounter: Payer: Self-pay | Admitting: Physician Assistant

## 2017-07-26 ENCOUNTER — Telehealth: Payer: Self-pay

## 2017-07-26 NOTE — Telephone Encounter (Signed)
Pt called and stated that she had misplaced her script for prednisone that Dr Assunta Found had written on 4/9.  K. Threasa Alpha, PA-C reordered medication.  Sent to [facility].  Pt notified.

## 2017-11-18 ENCOUNTER — Telehealth: Payer: Self-pay | Admitting: General Practice

## 2017-11-18 NOTE — Telephone Encounter (Signed)
symicort refill Last Refill:01/27/27  Last OV: 01/26/17 PCP: Dr Delia Chimes (previously seen by Ivar Drape) Pharmacy: [facility], [place], [place]

## 2017-11-18 NOTE — Telephone Encounter (Signed)
Copied from [place] [id number]. Topic: Quick Communication - Rx Refill/Question >> Nov 18, 2017 10:05 AM Scherrie Gerlach wrote: Medication: budesonide-formoterol (SYMBICORT) 80-4.5 MCG/ACT inhaler  Pharmacy called to follow up on this as they said they have sent several times [place], [place] - [address] [phone] (Phone) [phone] (Fax)

## 2017-11-21 NOTE — Telephone Encounter (Signed)
Left message to call back no release   Please advise patient she will need an appointment for refills .

## 2017-12-01 ENCOUNTER — Emergency Department (INDEPENDENT_AMBULATORY_CARE_PROVIDER_SITE_OTHER)
Admission: EM | Admit: 2017-12-01 | Discharge: 2017-12-01 | Disposition: A | Discharge status: Home / Self Care | Payer: Commercial Managed Care - PPO | Source: Home / Self Care

## 2017-12-01 ENCOUNTER — Encounter: Payer: Self-pay | Admitting: Family Medicine

## 2017-12-01 DIAGNOSIS — J4541 Moderate persistent asthma with (acute) exacerbation: Secondary | ICD-10-CM | POA: Diagnosis not present

## 2017-12-01 MED ORDER — PREDNISONE 20 MG PO TABS: ORAL_TABLET | ORAL | 1 refills | Status: DC

## 2017-12-01 MED ORDER — BUDESONIDE-FORMOTEROL FUMARATE 160-4.5 MCG/ACT IN AERO: 2.0000 | INHALATION_SPRAY | Freq: Two times a day (BID) | RESPIRATORY_TRACT | 12 refills | Status: DC

## 2017-12-01 NOTE — ED Provider Notes (Signed)
[name]    CSN: [id number] Arrival date & time: 12/01/17  1831     History   Chief Complaint Chief Complaint  Patient presents with  . Cough  . Nasal Congestion  . Wheezing    HPI Alexandra Morgan is a 37 y.o. female.   Sabiha complains of wheezing, some shortness of breath, cough and congestion for 2 weeks. She has a history of Asthma and takes Symbicort, Albuterol, Flonase, Zyrtec or Claritin.   Patient is a Occupational psychologist x 14 years.  Frequent am asthma attacks this past year.       Past Medical History:  Diagnosis Date  . Asthma   . Chlamydia 05/2014  . Fibroid   . HPV test positive 04/2014, 04/2015   normal cytology, negative subtype 16, 18/45. Recommend repeat Pap smear/HPV one year  . Migraines     There are no active problems to display for this patient.   Past Surgical History:  Procedure Laterality Date  . ABDOMINAL SURGERY  05/10   ABDOMINAL MYOMECTOMY Dr Elby Showers  . DENTAL SURGERY    . INCISION AND DRAINAGE BREAST ABSCESS      OB History    Gravida  1   Para      Term      Preterm      AB  1   Living  0     SAB      TAB  1   Ectopic      Multiple      Live Births               Home Medications    Prior to Admission medications   Medication Sig Start Date End Date Taking? Authorizing Provider  albuterol (PROVENTIL HFA;VENTOLIN HFA) 108 (90 Base) MCG/ACT inhaler Inhale 2 puffs into the lungs every 4 (four) hours as needed for wheezing or shortness of breath. 12/18/15  Yes English, Colletta Maryland D, PA  cetirizine (ZYRTEC) 10 MG tablet Take 1 tablet (10 mg total) by mouth daily. 01/26/17  Yes English, Stephanie D, PA  fluticasone (FLONASE) 50 MCG/ACT nasal spray Place 2 sprays into both nostrils daily. 01/26/17  Yes English, Colletta Maryland D, PA  folic acid (FOLVITE) 878 MCG tablet Take 400 mcg by mouth daily.   Yes [provider]  ibuprofen (ADVIL,MOTRIN) 800 MG tablet Take one tablet by mouth every 8 hours as  needed for cramping. 04/28/17  Yes Fontaine, Belinda Block, MD  Lactobacillus (ACIDOPHILUS PO) Take 1 tablet by mouth daily.   Yes [provider]  loratadine (CLARITIN) 10 MG tablet Take 1 tablet (10 mg total) by mouth daily. 03/15/15  Yes English, Colletta Maryland D, PA  Multiple Vitamins-Minerals (MULTIVITAMIN WITH MINERALS) tablet Take 1 tablet by mouth daily.   Yes [provider]  budesonide-formoterol (SYMBICORT) 160-4.5 MCG/ACT inhaler Inhale 2 puffs into the lungs 2 times daily at 12 noon and 4 pm. 12/01/17   Robyn Haber, MD  predniSONE (DELTASONE) 20 MG tablet 2 daily with food 12/01/17   Robyn Haber, MD    Family History Family History  Problem Relation Age of Onset  . Heart disease Father   . Hypertension Father   . Diabetes Father   . Stroke Father   . Hypertension Sister   . Diabetes Brother   . Hypertension Brother   . Cancer Mother        Bone  . Stroke Maternal Grandfather     Social History Social History   Tobacco Use  .  Smoking status: Never Smoker  . Smokeless tobacco: Never Used  Substance Use Topics  . Alcohol use: Yes    Alcohol/week: 0.0 standard drinks    Comment: Rare  . Drug use: No     Allergies   Patient has no known allergies.   Review of Systems Review of Systems  Constitutional: Negative.   HENT: Positive for congestion.   Respiratory: Positive for cough, chest tightness, shortness of breath and wheezing.   Cardiovascular: Negative.   Gastrointestinal: Negative.      Physical Exam Triage Vital Signs ED Triage Vitals  Enc Vitals Group     BP 12/01/17 1854 139/87     Pulse Rate 12/01/17 1854 97     Resp --      Temp 12/01/17 1854 98.3 F (36.8 C)     Temp Source 12/01/17 1854 Oral     SpO2 12/01/17 1854 98 %     Weight 12/01/17 1855 (!) 337 lb (152.9 kg)     Height 12/01/17 1855 5\' 8"  (1.727 m)     Head Circumference --      Peak Flow --      Pain Score 12/01/17 1855 0     Pain Loc --      Pain Edu? --        Excl. in [place]? --    No data found.  Updated Vital Signs BP 139/87 (BP Location: Right Arm)   Pulse 97   Temp 98.3 F (36.8 C) (Oral)   Ht 5\' 8"  (1.727 m)   Wt (!) 152.9 kg   SpO2 98%   BMI 51.24 kg/m   Physical Exam  Constitutional: She appears well-developed and well-nourished.  HENT:  Right Ear: External ear normal.  Left Ear: External ear normal.  Mouth/Throat: Oropharynx is clear and moist.  Eyes: Pupils are equal, round, and reactive to light. Conjunctivae are normal.  Neck: Normal range of motion. Neck supple.  Pulmonary/Chest: Effort normal.  Few rales left chest, clears with cough  Musculoskeletal: Normal range of motion.  Neurological: She is alert.  Skin: Skin is warm and dry.  Nursing note and vitals reviewed.    UC Treatments / Results  Labs (all labs ordered are listed, but only abnormal results are displayed) Labs Reviewed - No data to display  EKG None  Radiology No results found.  Procedures Procedures (including critical care time)  Medications Ordered in UC Medications - No data to display  Initial Impression / Assessment and Plan / UC Course  I have reviewed the triage vital signs and the nursing notes.  Pertinent labs & imaging results that were available during my care of the patient were reviewed by me and considered in my medical decision making (see chart for details).    Final Clinical Impressions(s) / UC Diagnoses   Final diagnoses:  Moderate persistent asthma with exacerbation   Discharge Instructions   None    ED Prescriptions    Medication Sig Dispense Auth. Provider   budesonide-formoterol (SYMBICORT) 160-4.5 MCG/ACT inhaler Inhale 2 puffs into the lungs 2 times daily at 12 noon and 4 pm. 1 Inhaler Robyn Haber, MD   predniSONE (DELTASONE) 20 MG tablet 2 daily with food 6 tablet Robyn Haber, MD     Controlled Substance Prescriptions [place] Controlled Substance Registry consulted? Not Applicable    Robyn Haber, MD 12/01/17 [phone]

## 2017-12-01 NOTE — ED Triage Notes (Signed)
Alexandra Morgan complains of wheezing, some shortness of breath, cough and congestion for 2 weeks. She has a history of Asthma and takes Symbicort, Albuterol, Flonase, Zyrtec or Claritin.

## 2017-12-03 ENCOUNTER — Telehealth: Payer: Self-pay | Admitting: Emergency Medicine

## 2017-12-03 NOTE — Telephone Encounter (Signed)
Patient states her breathing is better; started prednisone today and is using other rxs appropriately.

## 2018-03-07 ENCOUNTER — Ambulatory Visit (INDEPENDENT_AMBULATORY_CARE_PROVIDER_SITE_OTHER): Payer: Commercial Managed Care - PPO | Admitting: Gynecology

## 2018-03-07 DIAGNOSIS — Z23 Encounter for immunization: Secondary | ICD-10-CM

## 2018-05-03 ENCOUNTER — Encounter: Payer: Commercial Managed Care - PPO | Admitting: Gynecology

## 2018-05-22 ENCOUNTER — Encounter: Payer: Commercial Managed Care - PPO | Admitting: Obstetrics & Gynecology

## 2018-12-26 ENCOUNTER — Encounter: Payer: Self-pay | Admitting: Gynecology

## 2019-01-03 ENCOUNTER — Encounter: Payer: Self-pay | Admitting: Family Medicine

## 2019-01-03 ENCOUNTER — Other Ambulatory Visit: Payer: Self-pay

## 2019-01-03 ENCOUNTER — Emergency Department (INDEPENDENT_AMBULATORY_CARE_PROVIDER_SITE_OTHER)
Admission: EM | Admit: 2019-01-03 | Discharge: 2019-01-03 | Disposition: A | Discharge status: Home / Self Care | Payer: Commercial Managed Care - PPO | Source: Home / Self Care

## 2019-01-03 DIAGNOSIS — J4541 Moderate persistent asthma with (acute) exacerbation: Secondary | ICD-10-CM

## 2019-01-03 DIAGNOSIS — R062 Wheezing: Secondary | ICD-10-CM

## 2019-01-03 MED ORDER — BUDESONIDE-FORMOTEROL FUMARATE 160-4.5 MCG/ACT IN AERO: 2.0000 | INHALATION_SPRAY | Freq: Two times a day (BID) | RESPIRATORY_TRACT | 11 refills | Status: DC

## 2019-01-03 MED ORDER — ALBUTEROL SULFATE HFA 108 (90 BASE) MCG/ACT IN AERS: 2.0000 | INHALATION_SPRAY | RESPIRATORY_TRACT | 11 refills | Status: DC | PRN

## 2019-01-03 NOTE — ED Triage Notes (Signed)
Pt feels like her asthma is acting up and feels SOB.  Symbicort is low, and she thinks that may be the problem.

## 2019-01-03 NOTE — ED Provider Notes (Signed)
[name]    CSN: [id number] Arrival date & time: 01/03/19  1839      History   Chief Complaint Chief Complaint  Patient presents with  . Shortness of Breath    HPI Alexandra Morgan is a 38 y.o. female.   This is an [place] established 38 year old woman who presents today with shortness of breath.  She has a history of asthma.  Pt thinks her asthma is acting up and feels SOB.  Symbicort is low, and she states that may be the problem.  Has had no real problem with cough or loss of appetite, loss of sense of smell, nausea, vomiting.       Past Medical History:  Diagnosis Date  . Asthma   . Chlamydia 05/2014  . Fibroid   . HPV test positive 04/2014, 04/2015   normal cytology, negative subtype 16, 18/45. Recommend repeat Pap smear/HPV one year  . Migraines     There are no active problems to display for this patient.   Past Surgical History:  Procedure Laterality Date  . ABDOMINAL SURGERY  05/10   ABDOMINAL MYOMECTOMY Dr Elby Showers  . DENTAL SURGERY    . INCISION AND DRAINAGE BREAST ABSCESS      OB History    Gravida  1   Para      Term      Preterm      AB  1   Living  0     SAB      TAB  1   Ectopic      Multiple      Live Births               Home Medications    Prior to Admission medications   Medication Sig Start Date End Date Taking? Authorizing Provider  albuterol (VENTOLIN HFA) 108 (90 Base) MCG/ACT inhaler Inhale 2 puffs into the lungs every 4 (four) hours as needed for wheezing or shortness of breath. 01/03/19   Robyn Haber, MD  budesonide-formoterol [facility]) 160-4.5 MCG/ACT inhaler Inhale 2 puffs into the lungs 2 times daily at 12 noon and 4 pm. 01/03/19   Robyn Haber, MD  cetirizine (ZYRTEC) 10 MG tablet Take 1 tablet (10 mg total) by mouth daily. 01/26/17   Ivar Drape D, PA  fluticasone (FLONASE) 50 MCG/ACT nasal spray Place 2 sprays into both nostrils daily. 01/26/17   Ivar Drape D, PA  folic  acid (FOLVITE) A999333 MCG tablet Take 400 mcg by mouth daily.    [provider]  ibuprofen (ADVIL,MOTRIN) 800 MG tablet Take one tablet by mouth every 8 hours as needed for cramping. 04/28/17   Fontaine, Belinda Block, MD  Lactobacillus (ACIDOPHILUS PO) Take 1 tablet by mouth daily.    [provider]  loratadine (CLARITIN) 10 MG tablet Take 1 tablet (10 mg total) by mouth daily. 03/15/15   Joretta Bachelor, PA  Multiple Vitamins-Minerals (MULTIVITAMIN WITH MINERALS) tablet Take 1 tablet by mouth daily.    [provider]    Family History Family History  Problem Relation Age of Onset  . Heart disease Father   . Hypertension Father   . Diabetes Father   . Stroke Father   . Hypertension Sister   . Diabetes Brother   . Hypertension Brother   . Cancer Mother        Bone  . Stroke Maternal Grandfather     Social History Social History   Tobacco Use  . Smoking status: Never  Smoker  . Smokeless tobacco: Never Used  Substance Use Topics  . Alcohol use: Yes    Alcohol/week: 0.0 standard drinks    Comment: Rare  . Drug use: No     Allergies   Patient has no known allergies.   Review of Systems Review of Systems  Constitutional: Negative.   Respiratory: Positive for shortness of breath.   All other systems reviewed and are negative.    Physical Exam Triage Vital Signs ED Triage Vitals  Enc Vitals Group     BP      Pulse      Resp      Temp      Temp src      SpO2      Weight      Height      Head Circumference      Peak Flow      Pain Score      Pain Loc      Pain Edu?      Excl. in [place]?    No data found.  Updated Vital Signs BP 129/87 (BP Location: Right Arm)   Pulse 98   Temp 99.3 F (37.4 C) (Oral)   Resp 20   Ht 5\' 8"  (1.727 m)   Wt (!) 142.4 kg   LMP 12/26/2018   SpO2 99%   BMI 47.74 kg/m   Physical Exam Vitals signs and nursing note reviewed.  Constitutional:      Appearance: She is well-developed. She is obese.   HENT:     Head: Normocephalic.     Mouth/Throat:     Mouth: Mucous membranes are moist.     Pharynx: Oropharynx is clear.  Neck:     Musculoskeletal: Normal range of motion and neck supple.  Cardiovascular:     Rate and Rhythm: Normal rate and regular rhythm.  Pulmonary:     Effort: Pulmonary effort is normal.     Breath sounds: Normal breath sounds.  Musculoskeletal: Normal range of motion.  Skin:    General: Skin is warm and dry.  Neurological:     General: No focal deficit present.     Mental Status: She is alert.  Psychiatric:        Mood and Affect: Mood normal.        Behavior: Behavior normal.      UC Treatments / Results  Labs (all labs ordered are listed, but only abnormal results are displayed) Labs Reviewed - No data to display  EKG   Radiology No results found.  Procedures Procedures (including critical care time)  Medications Ordered in UC Medications - No data to display  Initial Impression / Assessment and Plan / UC Course  I have reviewed the triage vital signs and the nursing notes.  Pertinent labs & imaging results that were available during my care of the patient were reviewed by me and considered in my medical decision making (see chart for details).    Final Clinical Impressions(s) / UC Diagnoses   Final diagnoses:  Moderate persistent asthma with exacerbation   Discharge Instructions   None    ED Prescriptions    Medication Sig Dispense Auth. Provider   albuterol (VENTOLIN HFA) 108 (90 Base) MCG/ACT inhaler Inhale 2 puffs into the lungs every 4 (four) hours as needed for wheezing or shortness of breath. 6.7 g Robyn Haber, MD   budesonide-formoterol [facility]) 160-4.5 MCG/ACT inhaler Inhale 2 puffs into the lungs 2 times daily at 12 noon and  4 pm. 1 Inhaler Robyn Haber, MD     I have reviewed the PDMP during this encounter.   Robyn Haber, MD 01/03/19 Pauline Aus

## 2020-07-01 NOTE — Progress Notes (Signed)
40 y.o. G1P0010 Single Black or [place] American female here for annual exam.    Reports she has a "lump" under left arm and dry skin over right nipple.  Feels like she has odor in urine when she eats seafood  Has an area on lower abdomen left side from fibroid surgery at age 66, doesn't think it healed well. "Feels odd"  Not sexually active x 4 years  Periods always heavy, not changing, has known fibroids  Asking if can send in one Rx of Symbicort until she can get in with PCP  Works as a Occupational psychologist in long term care  Period Duration (Days): 5-7 Period Pattern: Regular Menstrual Flow:  (heavy for first 3 days then moderate flow) Menstrual Control: Maxi pad Dysmenorrhea:  (random) Patient's last menstrual period was 06/09/2020 (exact date).            Sexually active: No.  The current method of family planning is abstinence.    Exercising: No.  exercise Smoker:  no  Health Maintenance: Pap:  04-27-17 neg HPV HR neg History of abnormal Pap:  Yes 2017 MMG:  none Colonoscopy:  none BMD:   none Gardasil:   none Covid-19: not done Hep C testing: not done Screening Labs: will draw here   reports that she has never smoked. She has never used smokeless tobacco. She reports previous alcohol use. She reports that she does not use drugs.  Past Medical History:  Diagnosis Date  . Asthma   . Chlamydia 05/2014  . Fibroid   . HPV test positive 04/2014, 04/2015   normal cytology, negative subtype 16, 18/45. Recommend repeat Pap smear/HPV one year  . Migraines     Past Surgical History:  Procedure Laterality Date  . ABDOMINAL SURGERY  05/10   ABDOMINAL MYOMECTOMY Dr Elby Showers  . DENTAL SURGERY    . INCISION AND DRAINAGE BREAST ABSCESS      Current Outpatient Medications  Medication Sig Dispense Refill  . albuterol (VENTOLIN HFA) 108 (90 Base) MCG/ACT inhaler Inhale 2 puffs into the lungs every 4 (four) hours as needed for wheezing or shortness of breath. 6.7 g 11  .  budesonide-formoterol (SYMBICORT) 160-4.5 MCG/ACT inhaler Inhale 2 puffs into the lungs 2 times daily at 12 noon and 4 pm. 1 Inhaler 11  . fluticasone (FLONASE) 50 MCG/ACT nasal spray Place 2 sprays into both nostrils daily. 16 g 12  . ibuprofen (ADVIL,MOTRIN) 800 MG tablet Take one tablet by mouth every 8 hours as needed for cramping. 40 tablet 2  . Lactobacillus Rhamnosus, GG, (CULTURELLE PO) Take by mouth.    . loratadine (CLARITIN) 10 MG tablet Take 1 tablet (10 mg total) by mouth daily. 30 tablet 11   No current facility-administered medications for this visit.    Family History  Problem Relation Age of Onset  . Heart disease Father   . Hypertension Father   . Diabetes Father   . Stroke Father   . Heart attack Father   . Hypertension Sister   . Diabetes Brother   . Hypertension Brother   . Cancer Mother        Bone  . Stroke Maternal Grandfather     Review of Systems  Constitutional: Negative.   HENT: Negative.   Eyes: Negative.   Respiratory: Negative.   Cardiovascular: Negative.   Gastrointestinal: Negative.   Endocrine: Negative.   Genitourinary: Negative.   Musculoskeletal: Negative.   Skin:       Bump under arm (boil)?, area  by pelvic near where she had surgery that skin feels odd  Allergic/Immunologic: Negative.   Neurological: Negative.   Hematological: Negative.   Psychiatric/Behavioral: Negative.     Exam:   BP 110/80   Pulse 74   Resp 16   Ht 5\' 7"  (1.702 m)   Wt (!) 307 lb (139.3 kg)   LMP 06/09/2020 (Exact Date)   BMI 48.08 kg/m   Height: 5\' 7"  (170.2 cm)  General appearance: alert, cooperative and appears stated age, no acute distress Head: Normocephalic, without obvious abnormality Neck: no adenopathy, thyroid normal to inspection and palpation Lungs: clear to auscultation bilaterally Breasts: Normal to palpation without dominant masses Heart: regular rate and rhythm Abdomen: soft, non-tender; no masses,  no  organomegaly Nevus Extremities: extremities normal, no edema Skin: large nevus noted near surgical incision on left side of abdomen. Lymph nodes: Cervical, supraclavicular, and axillary nodes normal. Small elongated "pea size" lump noted in left axilla. Palpates superficial and is mildly tender. Suspect boil, however advised patient to call back if doesn't heal No abnormal inguinal nodes palpated Neurologic: Grossly normal   Pelvic: External genitalia:  no lesions              Urethra:  normal appearing urethra with no masses, tenderness or lesions              Bartholins and Skenes: normal                 Vagina: normal appearing vagina, appropriate for age, normal appearing discharge, no lesions              Cervix: neg cervical motion tenderness, no visible lesions             Bimanual Exam:   Uterus:  irregular and firm              Adnexa: no mass, fullness, tenderness                 Joy, CMA Chaperone was present for exam.  A:  Well Woman with normal exam  Hx fibroids/myomectomy  Hx anemia  Fm Hx (father) heart disease  Large nevus on abdomen  BV  Last A1C= 6.4  P:   Pap : collected today, Hx HPV  Mammogram:pt will call to schedule  Labs:CBC, CMP, lipid panel, A1C  Medications: metronidazole 500mg  bid x 7 days  Discussed Norethindrone (recommended), will most likely start if anemic  Will also consider Ultrasound to monitor fibroids. Pt desires to wait and see blood work to determine if really needs it.  Pt to call dermatology for large nevus, handout provided of dermatology providers  F/u pending labs

## 2020-07-03 ENCOUNTER — Other Ambulatory Visit: Payer: Self-pay

## 2020-07-03 ENCOUNTER — Encounter: Payer: Self-pay | Admitting: Nurse Practitioner

## 2020-07-03 ENCOUNTER — Ambulatory Visit (INDEPENDENT_AMBULATORY_CARE_PROVIDER_SITE_OTHER): Payer: Commercial Managed Care - PPO | Admitting: Nurse Practitioner

## 2020-07-03 ENCOUNTER — Other Ambulatory Visit (HOSPITAL_COMMUNITY)
Admission: RE | Admit: 2020-07-03 | Discharge: 2020-07-03 | Disposition: A | Discharge status: Home / Self Care | Payer: Commercial Managed Care - PPO | Source: Ambulatory Visit | Attending: Nurse Practitioner | Admitting: Nurse Practitioner

## 2020-07-03 VITALS — BP 110/80 | HR 74 | Resp 16 | Ht 67.0 in | Wt 307.0 lb

## 2020-07-03 DIAGNOSIS — B9689 Other specified bacterial agents as the cause of diseases classified elsewhere: Secondary | ICD-10-CM

## 2020-07-03 DIAGNOSIS — Z1329 Encounter for screening for other suspected endocrine disorder: Secondary | ICD-10-CM | POA: Diagnosis not present

## 2020-07-03 DIAGNOSIS — Z01419 Encounter for gynecological examination (general) (routine) without abnormal findings: Secondary | ICD-10-CM | POA: Diagnosis not present

## 2020-07-03 DIAGNOSIS — Z13228 Encounter for screening for other metabolic disorders: Secondary | ICD-10-CM

## 2020-07-03 DIAGNOSIS — Z130 Encounter for screening for diseases of the blood and blood-forming organs and certain disorders involving the immune mechanism: Secondary | ICD-10-CM | POA: Diagnosis not present

## 2020-07-03 DIAGNOSIS — Z13220 Encounter for screening for lipoid disorders: Secondary | ICD-10-CM

## 2020-07-03 DIAGNOSIS — N760 Acute vaginitis: Secondary | ICD-10-CM

## 2020-07-03 LAB — COMPREHENSIVE METABOLIC PANEL: CO2: 21 mmol/L (ref 20–32)

## 2020-07-03 MED ORDER — METRONIDAZOLE 500 MG PO TABS: 500.0000 mg | ORAL_TABLET | Freq: Two times a day (BID) | ORAL | 0 refills | Status: DC

## 2020-07-03 MED ORDER — BUDESONIDE-FORMOTEROL FUMARATE 160-4.5 MCG/ACT IN AERO: 2.0000 | INHALATION_SPRAY | Freq: Two times a day (BID) | RESPIRATORY_TRACT | 0 refills | Status: DC

## 2020-07-03 NOTE — Patient Instructions (Addendum)
If anemic, will call to discuss possible Ultrasound and starting Micronor (norethindrone 0.35mg ) Please call and schedule mammogram   Bacterial Vaginosis  Bacterial vaginosis is an infection that occurs when the normal balance of bacteria in the vagina changes. This change is caused by an overgrowth of certain bacteria in the vagina. Bacterial vaginosis is the most common vaginal infection among females aged 42 to 29 years. This condition increases the risk of sexually transmitted infections (STIs). Treatment can help reduce this risk. Treatment is very important for pregnant women because this condition can cause babies to be born early (prematurely) or at a low birth weight. What are the causes? This condition is caused by an increase in harmful bacteria that are normally present in small amounts in the vagina. However, the exact reason this condition develops is not known. You cannot get bacterial vaginosis from toilet seats, bedding, swimming pools, or contact with objects around you. What increases the risk? The following factors may make you more likely to develop this condition:  Having a new sexual partner or multiple sexual partners, or having unprotected sex.  Douching.  Having an intrauterine device (IUD).  Smoking.  Abusing drugs and alcohol. This may lead to riskier sexual behavior.  Taking certain antibiotic medicines.  Being pregnant. What are the signs or symptoms? Some women with this condition have no symptoms. Symptoms may include:  Pearline Cables or white vaginal discharge. The discharge can be watery or foamy.  A fish-like odor with discharge, especially after sex or during menstruation.  Itching in and around the vagina.  Burning or pain with urination. How is this diagnosed? This condition is diagnosed based on:  Your medical history.  A physical exam of the vagina.  Checking a sample of vaginal fluid for harmful bacteria or abnormal cells. How is this  treated? This condition is treated with antibiotic medicines. These may be given as a pill, a vaginal cream, or a medicine that is put into the vagina (suppository). If the condition comes back after treatment, a second round of antibiotics may be needed. Follow these instructions at home: Medicines  Take or apply over-the-counter and prescription medicines only as told by your health care provider.  Take or apply your antibiotic medicine as told by your health care provider. Do not stop using the antibiotic even if you start to feel better. General instructions  If you have a female sexual partner, tell her that you have a vaginal infection. She should follow up with her health care provider. If you have a female sexual partner, he does not need treatment.  Avoid sexual activity until you finish treatment.  Drink enough fluid to keep your urine pale yellow.  Keep the area around your vagina and rectum clean. ? Wash the area daily with warm water. ? Wipe yourself from front to back after using the toilet.  If you are breastfeeding, talk to your health care provider about continuing breastfeeding during treatment.  Keep all follow-up visits. This is important. How is this prevented? Self-care  Do not douche.  Wash the outside of your vagina with warm water only.  Wear cotton or cotton-lined underwear.  Avoid wearing tight pants and pantyhose, especially during the summer. Safe sex  Use protection when having sex. This includes: ? Using condoms. ? Using dental dams. This is a thin layer of a material made of latex or polyurethane that protects the mouth during oral sex.  Limit the number of sexual partners. To help prevent bacterial vaginosis,  it is best to have sex with just one partner (monogamous relationship).  Make sure you and your sexual partner are tested for STIs. Drugs and alcohol  Do not use any products that contain nicotine or tobacco. These products include  cigarettes, chewing tobacco, and vaping devices, such as e-cigarettes. If you need help quitting, ask your health care provider.  Do not use drugs.  Do not drink alcohol if: ? Your health care provider tells you not to do this. ? You are pregnant, may be pregnant, or are planning to become pregnant.  If you drink alcohol: ? Limit how much you have to 0-1 drink a day. ? Be aware of how much alcohol is in your drink. In the [place] one drink equals one 12 oz bottle of beer (355 mL), one 5 oz glass of wine (148 mL), or one 1 oz glass of hard liquor (44 mL). Where to find more information  Centers for Disease Control and Prevention: http://www.wolf.info/  American Sexual Health Association (ASHA): www.ashastd.org  U.S. Department of Health and Financial controller, Office on Women's Health: [place].tn Contact a health care provider if:  Your symptoms do not improve, even after treatment.  You have more discharge or pain when urinating.  You have a fever or chills.  You have pain in your abdomen or pelvis.  You have pain during sex.  You have vaginal bleeding between menstrual periods. Summary  Bacterial vaginosis is a vaginal infection that occurs when the normal balance of bacteria in the vagina changes. It results from an overgrowth of certain bacteria.  This condition increases the risk of sexually transmitted infections (STIs). Getting treated can help reduce this risk.  Treatment is very important for pregnant women because this condition can cause babies to be born early (prematurely) or at low birth weight.  This condition is treated with antibiotic medicines. These may be given as a pill, a vaginal cream, or a medicine that is put into the vagina (suppository). This information is not intended to replace advice given to you by your health care provider. Make sure you discuss any questions you have with your health care provider. Document Revised: 09/20/2019 Document Reviewed:  09/20/2019 Elsevier Patient Education  2021 [place].  Uterine Fibroids  Uterine fibroids, also called leiomyomas, are noncancerous (benign) tumors that can grow in the uterus. They can cause heavy menstrual bleeding and pain. Fibroids may also grow in the fallopian tubes, cervix, or tissues (ligaments) near the uterus. You may have one or many fibroids. Fibroids vary in size, weight, and where they grow in the uterus. Some can become quite large. Most fibroids do not require medical treatment. What are the causes? The cause of this condition is not known. What increases the risk? You are more likely to develop this condition if you:  Are in your 30s or 40s and have not gone through menopause.  Have a family history of this condition.  Are of African American descent.  Started your menstrual period at age 76 or younger.  Have never given birth.  Are overweight or obese. What are the signs or symptoms? Many women do not have any symptoms. Symptoms of this condition may include:  Heavy menstrual bleeding.  Bleeding between menstrual periods.  Pain and pressure in the pelvic area, between your hip bones.  Pain during sex.  Bladder problems, such as needing to urinate right away or more often than usual.  Inability to have children (infertility).  Failure to carry pregnancy to  term (miscarriage). How is this diagnosed? This condition may be diagnosed based on:  Your symptoms and medical history.  A physical exam.  A pelvic exam that includes feeling for any tumors.  Imaging tests, such as ultrasound or MRI. How is this treated? Treatment for this condition may include follow-up visits with your health care provider to monitor your fibroids for any changes. Other treatment may include:  Medicines, such as: ? Medicines to relieve pain, including aspirin and NSAIDs, such as ibuprofen or naproxen. ? Hormone therapy. Treatment may be given as a pill or an injection,  or it may be inserted into the uterus using an intrauterine device (IUD).  Surgery that would do one of the following: ? Remove the fibroids (myomectomy). This may be recommended if fibroids affect your fertility and you want to become pregnant. ? Remove the uterus (hysterectomy). ? Block the blood supply to the fibroids (uterine artery embolization). This can cause them to shrink and die. Follow these instructions at home: Medicines  Take over-the-counter and prescription medicines only as told by your health care provider.  Ask your health care provider if you should take iron pills or eat more iron-rich foods, such as dark green, leafy vegetables. Heavy menstrual bleeding can cause low iron levels. Managing pain If directed, apply heat to your back or abdomen to reduce pain. Use the heat source that your health care provider recommends, such as a moist heat pack or a heating pad. To apply heat:  Place a towel between your skin and the heat source.  Leave the heat on for 20-30 minutes.  Remove the heat if your skin turns bright red. This is especially important if you are unable to feel pain, heat, or cold. You may have a greater risk of getting burned.   General instructions  Pay close attention to your menstrual cycle. Tell your health care provider about any changes, such as: ? Heavier bleeding that requires you to change your pads or tampons more than usual. ? A change in the number of days that your menstrual period lasts. ? A change in symptoms that come with your menstrual period, such as back pain or cramps in your abdomen.  Keep all follow-up visits. This is important, especially if your fibroids need to be monitored for any changes. Contact a health care provider if you:  Have pelvic pain, back pain, or cramps in your abdomen that do not get better with medicine or heat.  Develop new bleeding between menstrual periods.  Have increased bleeding during or between menstrual  periods.  Feel more tired or weak than usual.  Feel light-headed. Get help right away if you:  Faint.  Have pelvic pain that suddenly gets worse.  Have severe vaginal bleeding that soaks a tampon or pad in 30 minutes or less. Summary  Uterine fibroids are noncancerous (benign) tumors that can develop in the uterus.  The exact cause of this condition is not known.  Most fibroids do not require medical treatment unless they affect your ability to have children (fertility).  Contact a health care provider if you have pelvic pain, back pain, or cramps in your abdomen that do not get better with medicines.  Get help right away if you faint, have pelvic pain that suddenly gets worse, or have severe vaginal bleeding. This information is not intended to replace advice given to you by your health care provider. Make sure you discuss any questions you have with your health care provider. Document Revised:  10/23/2019 Document Reviewed: 10/23/2019 Elsevier Patient Education  [place] Maintenance, Female Adopting a healthy lifestyle and getting preventive care are important in promoting health and wellness. Ask your health care provider about:  The right schedule for you to have regular tests and exams.  Things you can do on your own to prevent diseases and keep yourself healthy. What should I know about diet, weight, and exercise? Eat a healthy diet  Eat a diet that includes plenty of vegetables, fruits, low-fat dairy products, and lean protein.  Do not eat a lot of foods that are high in solid fats, added sugars, or sodium.   Maintain a healthy weight Body mass index (BMI) is used to identify weight problems. It estimates body fat based on height and weight. Your health care provider can help determine your BMI and help you achieve or maintain a healthy weight. Get regular exercise Get regular exercise. This is one of the most important things you can do for your  health. Most adults should:  Exercise for at least 150 minutes each week. The exercise should increase your heart rate and make you sweat (moderate-intensity exercise).  Do strengthening exercises at least twice a week. This is in addition to the moderate-intensity exercise.  Spend less time sitting. Even light physical activity can be beneficial. Watch cholesterol and blood lipids Have your blood tested for lipids and cholesterol at 42 years of age, then have this test every 5 years. Have your cholesterol levels checked more often if:  Your lipid or cholesterol levels are high.  You are older than 42 years of age.  You are at high risk for heart disease. What should I know about cancer screening? Depending on your health history and family history, you may need to have cancer screening at various ages. This may include screening for:  Breast cancer.  Cervical cancer.  Colorectal cancer.  Skin cancer.  Lung cancer. What should I know about heart disease, diabetes, and high blood pressure? Blood pressure and heart disease  High blood pressure causes heart disease and increases the risk of stroke. This is more likely to develop in people who have high blood pressure readings, are of African descent, or are overweight.  Have your blood pressure checked: ? Every 3-5 years if you are 61-29 years of age. ? Every year if you are 46 years old or older. Diabetes Have regular diabetes screenings. This checks your fasting blood sugar level. Have the screening done:  Once every three years after age 33 if you are at a normal weight and have a low risk for diabetes.  More often and at a younger age if you are overweight or have a high risk for diabetes. What should I know about preventing infection? Hepatitis B If you have a higher risk for hepatitis B, you should be screened for this virus. Talk with your health care provider to find out if you are at risk for hepatitis B  infection. Hepatitis C Testing is recommended for:  Everyone born from [birth year] through 1967.  Anyone with known risk factors for hepatitis C. Sexually transmitted infections (STIs)  Get screened for STIs, including gonorrhea and chlamydia, if: ? You are sexually active and are younger than 42 years of age. ? You are older than 42 years of age and your health care provider tells you that you are at risk for this type of infection. ? Your sexual activity has changed since you were last screened, and you are  at increased risk for chlamydia or gonorrhea. Ask your health care provider if you are at risk.  Ask your health care provider about whether you are at high risk for HIV. Your health care provider may recommend a prescription medicine to help prevent HIV infection. If you choose to take medicine to prevent HIV, you should first get tested for HIV. You should then be tested every 3 months for as long as you are taking the medicine. Pregnancy  If you are about to stop having your period (premenopausal) and you may become pregnant, seek counseling before you get pregnant.  Take 400 to 800 micrograms (mcg) of folic acid every day if you become pregnant.  Ask for birth control (contraception) if you want to prevent pregnancy. Osteoporosis and menopause Osteoporosis is a disease in which the bones lose minerals and strength with aging. This can result in bone fractures. If you are 24 years old or older, or if you are at risk for osteoporosis and fractures, ask your health care provider if you should:  Be screened for bone loss.  Take a calcium or vitamin D supplement to lower your risk of fractures.  Be given hormone replacement therapy (HRT) to treat symptoms of menopause. Follow these instructions at home: Lifestyle  Do not use any products that contain nicotine or tobacco, such as cigarettes, e-cigarettes, and chewing tobacco. If you need help quitting, ask your health care  provider.  Do not use street drugs.  Do not share needles.  Ask your health care provider for help if you need support or information about quitting drugs. Alcohol use  Do not drink alcohol if: ? Your health care provider tells you not to drink. ? You are pregnant, may be pregnant, or are planning to become pregnant.  If you drink alcohol: ? Limit how much you use to 0-1 drink a day. ? Limit intake if you are breastfeeding.  Be aware of how much alcohol is in your drink. In the [place] one drink equals one 12 oz bottle of beer (355 mL), one 5 oz glass of wine (148 mL), or one 1 oz glass of hard liquor (44 mL). General instructions  Schedule regular health, dental, and eye exams.  Stay current with your vaccines.  Tell your health care provider if: ? You often feel depressed. ? You have ever been abused or do not feel safe at home. Summary  Adopting a healthy lifestyle and getting preventive care are important in promoting health and wellness.  Follow your health care provider's instructions about healthy diet, exercising, and getting tested or screened for diseases.  Follow your health care provider's instructions on monitoring your cholesterol and blood pressure. This information is not intended to replace advice given to you by your health care provider. Make sure you discuss any questions you have with your health care provider. Document Revised: 03/15/2018 Document Reviewed: 03/15/2018 Elsevier Patient Education  2021 [employer].

## 2020-07-04 ENCOUNTER — Other Ambulatory Visit: Payer: Self-pay | Admitting: Nurse Practitioner

## 2020-07-04 DIAGNOSIS — N920 Excessive and frequent menstruation with regular cycle: Secondary | ICD-10-CM

## 2020-07-04 LAB — CBC
HCT: 32.1 % — ABNORMAL LOW (ref 35.0–45.0)
Hemoglobin: 8.8 g/dL — ABNORMAL LOW (ref 11.7–15.5)
MCH: 18.6 pg — ABNORMAL LOW (ref 27.0–33.0)
MCHC: 27.4 g/dL — ABNORMAL LOW (ref 32.0–36.0)
MCV: 67.9 fL — ABNORMAL LOW (ref 80.0–100.0)
MPV: 9.7 fL (ref 7.5–12.5)
Platelets: 418 10*3/uL — ABNORMAL HIGH (ref 140–400)
RBC: 4.73 10*6/uL (ref 3.80–5.10)
RDW: 19.7 % — ABNORMAL HIGH (ref 11.0–15.0)
WBC: 7.1 10*3/uL (ref 3.8–10.8)

## 2020-07-04 LAB — COMPREHENSIVE METABOLIC PANEL
AG Ratio: 1.2 (calc) (ref 1.0–2.5)
ALT: 11 U/L (ref 6–29)
AST: 14 U/L (ref 10–30)
Albumin: 4.1 g/dL (ref 3.6–5.1)
Alkaline phosphatase (APISO): 89 U/L (ref 31–125)
BUN: 11 mg/dL (ref 7–25)
Calcium: 8.9 mg/dL (ref 8.6–10.2)
Chloride: 106 mmol/L (ref 98–110)
Creat: 0.79 mg/dL (ref 0.50–1.10)
Globulin: 3.3 g/dL (calc) (ref 1.9–3.7)
Glucose, Bld: 87 mg/dL (ref 65–99)
Potassium: 4.1 mmol/L (ref 3.5–5.3)
Sodium: 138 mmol/L (ref 135–146)
Total Bilirubin: 0.3 mg/dL (ref 0.2–1.2)
Total Protein: 7.4 g/dL (ref 6.1–8.1)

## 2020-07-04 LAB — LIPID PANEL
Cholesterol: 181 mg/dL (ref <200)
HDL: 53 mg/dL (ref >=50)
LDL Cholesterol (Calc): 113 mg/dL (calc) — ABNORMAL HIGH
Non-HDL Cholesterol (Calc): 128 mg/dL (calc) (ref <130)
Total CHOL/HDL Ratio: 3.4 (calc) (ref <5.0)
Triglycerides: 63 mg/dL (ref <150)

## 2020-07-04 LAB — CYTOLOGY - PAP
Comment: NEGATIVE
Diagnosis: NEGATIVE
High risk HPV: NEGATIVE

## 2020-07-04 LAB — HEMOGLOBIN A1C
Hgb A1c MFr Bld: 6.3 % of total Hgb — ABNORMAL HIGH (ref <5.7)
Mean Plasma Glucose: 134 mg/dL
eAG (mmol/L): 7.4 mmol/L

## 2020-07-04 LAB — WET PREP FOR TRICH, YEAST, CLUE

## 2020-07-04 MED ORDER — NORETHINDRONE 0.35 MG PO TABS: 1.0000 | ORAL_TABLET | Freq: Every day | ORAL | 3 refills | Status: DC

## 2020-07-04 NOTE — Progress Notes (Signed)
At physical exam, discussed previous anemia. Pt does not report any symptoms, although she has fibroids. Stated her bleeding was heavy but did not last long. Was not certain that she wanted treatment or Ultrasound. Discussed if her hemoglobin was low, I would recommend treatment.  Hemoglobin 8.8 Will start micronor and daily iron Will order pelvic US  F/u for Pelvic US or if declines,  at minimum 6 week follow up for re-evaluation of anemia, and effectiveness of Micronor and iron therapy. Will most likely recommend hematology referral.  Karma Ganja, [facility]

## 2020-07-30 ENCOUNTER — Telehealth: Payer: Self-pay | Admitting: *Deleted

## 2020-07-30 NOTE — Telephone Encounter (Signed)
Breast order arrived from [place] [facility] and faxed to [phone]. For diagnostic right breast and possible right breast ultrasound. They will call patient to schedule.

## 2020-07-30 NOTE — Telephone Encounter (Signed)
-----   Message from Karma Ganja, NP sent at 07/29/2020  4:41 PM EDT ----- Received report from [facility] Radiology [facility]) Screening mammogram was done 07/16/2020 with possible mass in anterior upper right breast. Additional Imaging recommended. Needs Right diagnositic mammogram and possible right breast ultrasound.  This may have been scheduled by [facility], it is un clear and no further records are available. Please follow up to assure this is scheduled. Karma Ganja, [facility]

## 2020-08-14 ENCOUNTER — Ambulatory Visit: Payer: Commercial Managed Care - PPO | Admitting: Nurse Practitioner

## 2020-08-14 DIAGNOSIS — Z0289 Encounter for other administrative examinations: Secondary | ICD-10-CM

## 2020-11-02 ENCOUNTER — Other Ambulatory Visit: Payer: Self-pay

## 2020-11-02 ENCOUNTER — Emergency Department (INDEPENDENT_AMBULATORY_CARE_PROVIDER_SITE_OTHER)
Admission: EM | Admit: 2020-11-02 | Discharge: 2020-11-02 | Disposition: A | Discharge status: Home / Self Care | Payer: Commercial Managed Care - PPO | Source: Home / Self Care

## 2020-11-02 DIAGNOSIS — J0100 Acute maxillary sinusitis, unspecified: Secondary | ICD-10-CM

## 2020-11-02 DIAGNOSIS — J309 Allergic rhinitis, unspecified: Secondary | ICD-10-CM

## 2020-11-02 DIAGNOSIS — J4521 Mild intermittent asthma with (acute) exacerbation: Secondary | ICD-10-CM

## 2020-11-02 DIAGNOSIS — J4541 Moderate persistent asthma with (acute) exacerbation: Secondary | ICD-10-CM

## 2020-11-02 DIAGNOSIS — Z01419 Encounter for gynecological examination (general) (routine) without abnormal findings: Secondary | ICD-10-CM

## 2020-11-02 DIAGNOSIS — R059 Cough, unspecified: Secondary | ICD-10-CM

## 2020-11-02 HISTORY — DX: Anemia, unspecified: D64.9

## 2020-11-02 MED ORDER — AMOXICILLIN-POT CLAVULANATE 875-125 MG PO TABS: 1.0000 | ORAL_TABLET | Freq: Two times a day (BID) | ORAL | 0 refills | Status: AC

## 2020-11-02 MED ORDER — BENZONATATE 200 MG PO CAPS: 200.0000 mg | ORAL_CAPSULE | Freq: Three times a day (TID) | ORAL | 0 refills | Status: AC | PRN

## 2020-11-02 MED ORDER — FLUCONAZOLE 150 MG PO TABS: 150.0000 mg | ORAL_TABLET | Freq: Every day | ORAL | 0 refills | Status: DC

## 2020-11-02 MED ORDER — FEXOFENADINE HCL 180 MG PO TABS: 180.0000 mg | ORAL_TABLET | Freq: Every day | ORAL | 0 refills | Status: DC

## 2020-11-02 MED ORDER — BUDESONIDE-FORMOTEROL FUMARATE 160-4.5 MCG/ACT IN AERO: 2.0000 | INHALATION_SPRAY | Freq: Two times a day (BID) | RESPIRATORY_TRACT | 0 refills | Status: DC

## 2020-11-02 MED ORDER — ALBUTEROL SULFATE HFA 108 (90 BASE) MCG/ACT IN AERS: 2.0000 | INHALATION_SPRAY | RESPIRATORY_TRACT | 0 refills | Status: DC | PRN

## 2020-11-02 NOTE — ED Provider Notes (Signed)
[name]    CSN: [id number] Arrival date & time: 11/02/20  [id number]      History   Chief Complaint Chief Complaint  Patient presents with   Cough    HPI Alexandra Morgan is a 40 y.o. female.   HPI 40 year old female presents with productive cough for 7 days.  Reports not being tested for COVID-19 and has not been vaccinated.  Denies fever or myalgias.  Past Medical History:  Diagnosis Date   Anemia    Asthma    Chlamydia 05/06/2014   Fibroid    HPV test positive 04/2014, 04/2015   normal cytology, negative subtype 16, 18/45. Recommend repeat Pap smear/HPV one year   Migraines     There are no problems to display for this patient.   Past Surgical History:  Procedure Laterality Date   ABDOMINAL SURGERY  05/10   ABDOMINAL MYOMECTOMY Dr Elby Showers   DENTAL SURGERY     INCISION AND DRAINAGE BREAST ABSCESS      OB History     Gravida  1   Para      Term      Preterm      AB  1   Living  0      SAB      IAB  1   Ectopic      Multiple      Live Births               Home Medications    Prior to Admission medications   Medication Sig Start Date End Date Taking? Authorizing Provider  amoxicillin-clavulanate (AUGMENTIN) 875-125 MG tablet Take 1 tablet by mouth every 12 (twelve) hours for 5 days. 11/02/20 11/07/20 Yes Eliezer Lofts, FNP  benzonatate (TESSALON) 200 MG capsule Take 1 capsule (200 mg total) by mouth 3 (three) times daily as needed for up to 7 days for cough. 11/02/20 11/09/20 Yes Eliezer Lofts, FNP  fexofenadine [facility] ALLERGY) 180 MG tablet Take 1 tablet (180 mg total) by mouth daily for 15 days. 11/02/20 11/17/20 Yes Eliezer Lofts, FNP  fluconazole (DIFLUCAN) 150 MG tablet Take 1 tablet (150 mg total) by mouth daily. 11/02/20  Yes Eliezer Lofts, FNP  norethindrone (MICRONOR) 0.35 MG tablet Take 1 tablet (0.35 mg total) by mouth daily. 07/04/20  Yes Karma Ganja, NP  albuterol (VENTOLIN HFA) 108 (90 Base) MCG/ACT inhaler Inhale 2  puffs into the lungs every 4 (four) hours as needed for up to 1 day for wheezing or shortness of breath. 11/02/20 11/03/20  Eliezer Lofts, FNP  budesonide-formoterol (SYMBICORT) 160-4.5 MCG/ACT inhaler Inhale 2 puffs into the lungs 2 times daily at 12 noon and 4 pm. 11/02/20   Eliezer Lofts, FNP  fluticasone (FLONASE) 50 MCG/ACT nasal spray Place 2 sprays into both nostrils daily. 01/26/17   Ivar Drape D, PA  ibuprofen (ADVIL,MOTRIN) 800 MG tablet Take one tablet by mouth every 8 hours as needed for cramping. 04/28/17   Fontaine, Belinda Block, MD  Lactobacillus Rhamnosus, GG, (CULTURELLE PO) Take by mouth.    [provider]  metroNIDAZOLE (FLAGYL) 500 MG tablet Take 1 tablet (500 mg total) by mouth 2 (two) times daily. 07/03/20   Karma Ganja, NP    Family History Family History  Problem Relation Age of Onset   Heart disease Father    Hypertension Father    Diabetes Father    Stroke Father    Heart attack Father    Hypertension Sister    Diabetes Brother  Hypertension Brother    Cancer Mother        Bone   Stroke Maternal Grandfather     Social History Social History   Tobacco Use   Smoking status: Never   Smokeless tobacco: Never  Vaping Use   Vaping Use: Never used  Substance Use Topics   Alcohol use: Not Currently    Alcohol/week: 0.0 standard drinks   Drug use: Never     Allergies   Patient has no known allergies.   Review of Systems Review of Systems  Respiratory:  Positive for cough.   All other systems reviewed and are negative.   Physical Exam Triage Vital Signs ED Triage Vitals  Enc Vitals Group     BP 11/02/20 0904 (!) 130/93     Pulse Rate 11/02/20 0904 91     Resp 11/02/20 0904 20     Temp 11/02/20 0904 98.7 F (37.1 C)     Temp Source 11/02/20 0904 Oral     SpO2 11/02/20 0904 100 %     Weight 11/02/20 0900 300 lb (136.1 kg)     Height 11/02/20 0900 5' 7.5" (1.715 m)     Head Circumference --      Peak Flow --      Pain Score  11/02/20 0900 0     Pain Loc --      Pain Edu? --      Excl. in [place]? --    No data found.  Updated Vital Signs BP (!) 130/93   Pulse 91   Temp 98.7 F (37.1 C) (Oral)   Resp 20   Ht 5' 7.5" (1.715 m)   Wt 300 lb (136.1 kg)   LMP 10/24/2020   SpO2 100%   BMI 46.29 kg/m      Physical Exam Vitals and nursing note reviewed.  Constitutional:      General: She is not in acute distress.    Appearance: She is obese. She is not ill-appearing.  HENT:     Head: Normocephalic and atraumatic.     Right Ear: Tympanic membrane and external ear normal.     Left Ear: Tympanic membrane and external ear normal.     Ears:     Comments: Eustachian tube dysfunction noted bilaterally    Nose: Congestion present.     Comments: Turbinates are erythematous    Mouth/Throat:     Mouth: Mucous membranes are moist.     Pharynx: Oropharynx is clear.     Comments: Moderate amount of clear drainage of posterior oropharynx noted Eyes:     Extraocular Movements: Extraocular movements intact.     Conjunctiva/sclera: Conjunctivae normal.     Pupils: Pupils are equal, round, and reactive to light.  Cardiovascular:     Rate and Rhythm: Normal rate and regular rhythm.     Pulses: Normal pulses.     Heart sounds: Normal heart sounds.  Pulmonary:     Effort: Pulmonary effort is normal. No respiratory distress.     Breath sounds: Normal breath sounds. No stridor. No wheezing, rhonchi or rales.  Chest:     Chest wall: No tenderness.  Musculoskeletal:        General: Normal range of motion.     Cervical back: Normal range of motion and neck supple. No tenderness.  Lymphadenopathy:     Cervical: No cervical adenopathy.  Skin:    General: Skin is warm and dry.  Neurological:     General: No focal deficit  present.     Mental Status: She is alert and oriented to person, place, and time. Mental status is at baseline.  Psychiatric:        Mood and Affect: Mood normal.        Behavior: Behavior normal.      UC Treatments / Results  Labs (all labs ordered are listed, but only abnormal results are displayed) Labs Reviewed - No data to display  EKG   Radiology No results found.  Procedures Procedures (including critical care time)  Medications Ordered in UC Medications - No data to display  Initial Impression / Assessment and Plan / UC Course  I have reviewed the triage vital signs and the nursing notes.  Pertinent labs & imaging results that were available during my care of the patient were reviewed by me and considered in my medical decision making (see chart for details).    DM: 1.  Subacute maxillary sinusitis-Rx'd Augmentin; 2.  Mild intermittent's asthma with exacerbation-refilled patient's albuterol inhaler and Symbicort per request; 3.  Cough-Rx'd Tessalon Perles; 4.  Allergic rhinitis-Rx'd Allegra.  Work note provided to patient prior to discharge.  Patient discharged home, hemodynamically stable.  Final Clinical Impressions(s) / UC Diagnoses   Final diagnoses:  Cough  Subacute maxillary sinusitis  Allergic rhinitis, unspecified seasonality, unspecified trigger  Mild intermittent asthma with exacerbation  Wheezing  Moderate persistent asthma with acute exacerbation  Well woman exam with routine gynecological exam     Discharge Instructions      Advised patient to take medication as directed with food to completion.  Advised patient to discontinue Claritin and start Allegra.  Advised patient increase daily water intake while taking these medications.     ED Prescriptions     Medication Sig Dispense Auth. Provider   amoxicillin-clavulanate (AUGMENTIN) 875-125 MG tablet Take 1 tablet by mouth every 12 (twelve) hours for 5 days. 10 tablet Eliezer Lofts, FNP   benzonatate (TESSALON) 200 MG capsule Take 1 capsule (200 mg total) by mouth 3 (three) times daily as needed for up to 7 days for cough. 30 capsule Eliezer Lofts, FNP   fexofenadine [facility] ALLERGY)  180 MG tablet Take 1 tablet (180 mg total) by mouth daily for 15 days. 15 tablet Eliezer Lofts, FNP   albuterol (VENTOLIN HFA) 108 (90 Base) MCG/ACT inhaler Inhale 2 puffs into the lungs every 4 (four) hours as needed for up to 1 day for wheezing or shortness of breath. 6.7 g Eliezer Lofts, FNP   budesonide-formoterol [facility]) 160-4.5 MCG/ACT inhaler Inhale 2 puffs into the lungs 2 times daily at 12 noon and 4 pm. 1 each Eliezer Lofts, FNP   fluconazole (DIFLUCAN) 150 MG tablet Take 1 tablet (150 mg total) by mouth daily. 2 tablet Eliezer Lofts, FNP      PDMP not reviewed this encounter.   Eliezer Lofts, [place] 11/02/20 [phone]

## 2020-11-02 NOTE — ED Triage Notes (Signed)
Pt presents to Urgent Care with c/o productive cough x over one week, worsening over the past few days. Has not tested for COVID; has not been vaccinated. She states, "I think it's just my asthma."

## 2020-11-02 NOTE — Discharge Instructions (Addendum)
Advised patient to take medication as directed with food to completion.  Advised patient to discontinue Claritin and start Allegra.  Advised patient increase daily water intake while taking these medications.

## 2021-02-10 ENCOUNTER — Emergency Department (INDEPENDENT_AMBULATORY_CARE_PROVIDER_SITE_OTHER)
Admission: EM | Admit: 2021-02-10 | Discharge: 2021-02-10 | Disposition: A | Discharge status: Home / Self Care | Payer: Commercial Managed Care - PPO | Source: Home / Self Care | Attending: Family Medicine | Admitting: Family Medicine

## 2021-02-10 ENCOUNTER — Other Ambulatory Visit: Payer: Self-pay

## 2021-02-10 DIAGNOSIS — R519 Headache, unspecified: Secondary | ICD-10-CM | POA: Diagnosis not present

## 2021-02-10 MED ORDER — KETOROLAC TROMETHAMINE 60 MG/2ML IM SOLN
60.0000 mg | Freq: Once | INTRAMUSCULAR | Status: AC
  Administered 2021-02-10: 60 mg via INTRAMUSCULAR

## 2021-02-10 MED ORDER — KETOROLAC TROMETHAMINE 10 MG PO TABS: 10.0000 mg | ORAL_TABLET | Freq: Four times a day (QID) | ORAL | 0 refills | Status: DC | PRN

## 2021-02-10 MED ORDER — CYCLOBENZAPRINE HCL 5 MG PO TABS: ORAL_TABLET | ORAL | 0 refills | Status: DC

## 2021-02-10 NOTE — ED Provider Notes (Signed)
[name]    CSN: [id number] Arrival date & time: 02/10/21  1042      History   Chief Complaint Chief Complaint  Patient presents with   Headache    HPI Alexandra Morgan is a 40 y.o. female.   HPI  Patient states that she has a history of migraines.  She has a headache since yesterday.  Its more on the right side of her head.  She states she is also had sinus problems.  She is uncertain whether she has a sinus or a migraine headache.  She has no nausea or vomiting.  She does have some visual symptoms.  She does not have any runny nose sinus congestion sore throat or postnasal drip  Past Medical History:  Diagnosis Date   Anemia    Asthma    Chlamydia 05/06/2014   Fibroid    HPV test positive 04/2014, 04/2015   normal cytology, negative subtype 16, 18/45. Recommend repeat Pap smear/HPV one year   Migraines     There are no problems to display for this patient.   Past Surgical History:  Procedure Laterality Date   ABDOMINAL SURGERY  05/10   ABDOMINAL MYOMECTOMY Dr Elby Showers   DENTAL SURGERY     INCISION AND DRAINAGE BREAST ABSCESS      OB History     Gravida  1   Para      Term      Preterm      AB  1   Living  0      SAB      IAB  1   Ectopic      Multiple      Live Births               Home Medications    Prior to Admission medications   Medication Sig Start Date End Date Taking? Authorizing Provider  cyclobenzaprine (FLEXERIL) 5 MG tablet Take 1 or 2 at bedtime to reduce muscle tension 02/10/21  Yes Raylene Everts, MD  ferrous sulfate 325 (65 FE) MG tablet Take 325 mg by mouth daily with breakfast.   Yes [provider]  ketorolac (TORADOL) 10 MG tablet Take 1 tablet (10 mg total) by mouth every 6 (six) hours as needed. 02/10/21  Yes Raylene Everts, MD  albuterol (VENTOLIN HFA) 108 (90 Base) MCG/ACT inhaler Inhale 2 puffs into the lungs every 4 (four) hours as needed for up to 1 day for wheezing or shortness of  breath. 11/02/20 11/03/20  Eliezer Lofts, FNP  budesonide-formoterol (SYMBICORT) 160-4.5 MCG/ACT inhaler Inhale 2 puffs into the lungs 2 times daily at 12 noon and 4 pm. 11/02/20   Eliezer Lofts, FNP  fluticasone (FLONASE) 50 MCG/ACT nasal spray Place 2 sprays into both nostrils daily. 01/26/17   Ivar Drape D, PA  ibuprofen (ADVIL,MOTRIN) 800 MG tablet Take one tablet by mouth every 8 hours as needed for cramping. 04/28/17   Fontaine, Belinda Block, MD  Lactobacillus Rhamnosus, GG, (CULTURELLE PO) Take by mouth.    [provider]    Family History Family History  Problem Relation Age of Onset   Heart disease Father    Hypertension Father    Diabetes Father    Stroke Father    Heart attack Father    Hypertension Sister    Diabetes Brother    Hypertension Brother    Cancer Mother        Bone   Stroke Maternal Grandfather  Social History Social History   Tobacco Use   Smoking status: Never   Smokeless tobacco: Never  Vaping Use   Vaping Use: Never used  Substance Use Topics   Alcohol use: Not Currently    Alcohol/week: 0.0 standard drinks   Drug use: Never     Allergies   Patient has no known allergies.   Review of Systems Review of Systems See HPI  Physical Exam Triage Vital Signs ED Triage Vitals  Enc Vitals Group     BP 02/10/21 1159 (!) 149/86     Pulse Rate 02/10/21 1159 88     Resp 02/10/21 1159 20     Temp 02/10/21 1159 98.6 F (37 C)     Temp Source 02/10/21 1159 Oral     SpO2 02/10/21 1200 99 %     Weight 02/10/21 1155 (!) 315 lb (142.9 kg)     Height 02/10/21 1155 5' 7.5" (1.715 m)     Head Circumference --      Peak Flow --      Pain Score 02/10/21 1155 7     Pain Loc --      Pain Edu? --      Excl. in [place]? --    No data found.  Updated Vital Signs BP (!) 149/86 (BP Location: Left Arm)   Pulse 88   Temp 98.6 F (37 C) (Oral)   Resp 20   Ht 5' 7.5" (1.715 m)   Wt (!) 142.9 kg   LMP 02/07/2021 (Exact Date)   SpO2 99%    BMI 48.61 kg/m      Physical Exam Constitutional:      General: She is not in acute distress.    Appearance: She is well-developed. She is obese.  HENT:     Head: Normocephalic and atraumatic.     Right Ear: Tympanic membrane, ear canal and external ear normal.     Left Ear: Tympanic membrane, ear canal and external ear normal.     Nose: Nose normal. No congestion.     Mouth/Throat:     Mouth: Mucous membranes are moist.     Pharynx: No posterior oropharyngeal erythema.  Eyes:     Conjunctiva/sclera: Conjunctivae normal.     Pupils: Pupils are equal, round, and reactive to light.  Cardiovascular:     Rate and Rhythm: Normal rate.  Pulmonary:     Effort: Pulmonary effort is normal. No respiratory distress.  Abdominal:     General: There is no distension.     Palpations: Abdomen is soft.  Musculoskeletal:        General: Normal range of motion.     Cervical back: Normal range of motion.  Lymphadenopathy:     Cervical: No cervical adenopathy.  Skin:    General: Skin is warm and dry.  Neurological:     General: No focal deficit present.     Mental Status: She is alert.  Psychiatric:        Mood and Affect: Mood normal.        Behavior: Behavior normal.     UC Treatments / Results  Labs (all labs ordered are listed, but only abnormal results are displayed) Labs Reviewed - No data to display  EKG   Radiology No results found.  Procedures Procedures (including critical care time)  Medications Ordered in UC Medications  ketorolac (TORADOL) injection 60 mg (60 mg Intramuscular Given 02/10/21 1236)    Initial Impression / Assessment and Plan / UC  Course  I have reviewed the triage vital signs and the nursing notes.  Pertinent labs & imaging results that were available during my care of the patient were reviewed by me and considered in my medical decision making (see chart for details).     Muscle tension headache versus migraine headache.   Final Clinical  Impressions(s) / UC Diagnoses   Final diagnoses:  Bad headache     Discharge Instructions      Take Toradol for headache pain Take cyclobenzaprine 1 or 2 at bedtime for muscle tension Get plenty of rest See your doctor in follow-up     ED Prescriptions     Medication Sig Dispense Auth. Provider   ketorolac (TORADOL) 10 MG tablet Take 1 tablet (10 mg total) by mouth every 6 (six) hours as needed. 20 tablet Raylene Everts, MD   cyclobenzaprine (FLEXERIL) 5 MG tablet Take 1 or 2 at bedtime to reduce muscle tension 30 tablet Raylene Everts, MD      PDMP not reviewed this encounter.   Raylene Everts, MD 02/10/21 [phone]

## 2021-02-10 NOTE — Discharge Instructions (Signed)
Take Toradol for headache pain Take cyclobenzaprine 1 or 2 at bedtime for muscle tension Get plenty of rest See your doctor in follow-up

## 2021-02-10 NOTE — ED Triage Notes (Signed)
Pt presents to Urgent Care with c/o severe headache since yesterday. Has hx of migraine headaches. Denies N/V and numbness. States she had diarrhea last night. Afebrile.

## 2021-03-20 ENCOUNTER — Other Ambulatory Visit: Payer: Self-pay

## 2021-03-20 ENCOUNTER — Emergency Department (INDEPENDENT_AMBULATORY_CARE_PROVIDER_SITE_OTHER)
Admission: EM | Admit: 2021-03-20 | Discharge: 2021-03-20 | Disposition: A | Discharge status: Home / Self Care | Payer: Commercial Managed Care - PPO | Source: Home / Self Care

## 2021-03-20 DIAGNOSIS — J4521 Mild intermittent asthma with (acute) exacerbation: Secondary | ICD-10-CM | POA: Diagnosis not present

## 2021-03-20 DIAGNOSIS — R059 Cough, unspecified: Secondary | ICD-10-CM

## 2021-03-20 DIAGNOSIS — Z01419 Encounter for gynecological examination (general) (routine) without abnormal findings: Secondary | ICD-10-CM

## 2021-03-20 MED ORDER — BUDESONIDE-FORMOTEROL FUMARATE 160-4.5 MCG/ACT IN AERO: 2.0000 | INHALATION_SPRAY | Freq: Two times a day (BID) | RESPIRATORY_TRACT | 0 refills | Status: DC

## 2021-03-20 MED ORDER — CEFDINIR 300 MG PO CAPS: 300.0000 mg | ORAL_CAPSULE | Freq: Two times a day (BID) | ORAL | 0 refills | Status: AC

## 2021-03-20 MED ORDER — PREDNISONE 20 MG PO TABS: ORAL_TABLET | ORAL | 0 refills | Status: DC

## 2021-03-20 MED ORDER — FLUCONAZOLE 150 MG PO TABS: ORAL_TABLET | ORAL | 0 refills | Status: DC

## 2021-03-20 NOTE — Discharge Instructions (Addendum)
Advised patient to take medications as directed with food to completion.  Advised patient to take Prednisone with first dose of Cefdinir for 5 of next 7 days.  Advised patient we have refilled her Symbicort inhaler per her request.  Encouraged patient increase daily water intake while taking these medications.

## 2021-03-20 NOTE — ED Provider Notes (Signed)
[name]    CSN: [id number] Arrival date & time: 03/20/21  1147      History   Chief Complaint Chief Complaint  Patient presents with   Cough    HPI Alexandra Morgan is a 40 y.o. female.   HPI 40 year old female presents with cough for 1 week.  PMH significant for asthma and is currently using Symbicort daily.  Reports that her new upstairs neighbors are smokers.  Past Medical History:  Diagnosis Date   Anemia    Asthma    Chlamydia 05/06/2014   Fibroid    HPV test positive 04/2014, 04/2015   normal cytology, negative subtype 16, 18/45. Recommend repeat Pap smear/HPV one year   Migraines     There are no problems to display for this patient.   Past Surgical History:  Procedure Laterality Date   ABDOMINAL SURGERY  05/10   ABDOMINAL MYOMECTOMY Dr Elby Showers   DENTAL SURGERY     INCISION AND DRAINAGE BREAST ABSCESS      OB History     Gravida  1   Para      Term      Preterm      AB  1   Living  0      SAB      IAB  1   Ectopic      Multiple      Live Births               Home Medications    Prior to Admission medications   Medication Sig Start Date End Date Taking? Authorizing Provider  cefdinir (OMNICEF) 300 MG capsule Take 1 capsule (300 mg total) by mouth 2 (two) times daily for 7 days. 03/20/21 03/27/21 Yes Eliezer Lofts, FNP  fluconazole (DIFLUCAN) 150 MG tablet Take 1 tab p.o. for vaginal candidiasis, may repeat 1 tab p.o. in 3 days if symptoms are not resolved. 03/20/21  Yes Eliezer Lofts, FNP  predniSONE (DELTASONE) 20 MG tablet Take 3 tabs PO daily x 5 days. 03/20/21  Yes Eliezer Lofts, FNP  albuterol (VENTOLIN HFA) 108 (90 Base) MCG/ACT inhaler Inhale 2 puffs into the lungs every 4 (four) hours as needed for up to 1 day for wheezing or shortness of breath. 11/02/20 11/03/20  Eliezer Lofts, FNP  budesonide-formoterol (SYMBICORT) 160-4.5 MCG/ACT inhaler Inhale 2 puffs into the lungs 2 times daily at 12 noon and 4 pm.  03/20/21   Eliezer Lofts, FNP  cyclobenzaprine (FLEXERIL) 5 MG tablet Take 1 or 2 at bedtime to reduce muscle tension 02/10/21   Raylene Everts, MD  ferrous sulfate 325 (65 FE) MG tablet Take 325 mg by mouth daily with breakfast.    [provider]  fluticasone (FLONASE) 50 MCG/ACT nasal spray Place 2 sprays into both nostrils daily. 01/26/17   Ivar Drape D, PA  ibuprofen (ADVIL,MOTRIN) 800 MG tablet Take one tablet by mouth every 8 hours as needed for cramping. 04/28/17   Fontaine, Belinda Block, MD  ketorolac (TORADOL) 10 MG tablet Take 1 tablet (10 mg total) by mouth every 6 (six) hours as needed. 02/10/21   Raylene Everts, MD  Lactobacillus Rhamnosus, GG, (CULTURELLE PO) Take by mouth.    [provider]    Family History Family History  Problem Relation Age of Onset   Heart disease Father    Hypertension Father    Diabetes Father    Stroke Father    Heart attack Father    Hypertension Sister  Diabetes Brother    Hypertension Brother    Cancer Mother        Bone   Stroke Maternal Grandfather     Social History Social History   Tobacco Use   Smoking status: Never   Smokeless tobacco: Never  Vaping Use   Vaping Use: Never used  Substance Use Topics   Alcohol use: Not Currently    Alcohol/week: 0.0 standard drinks   Drug use: Never     Allergies   Patient has no known allergies.   Review of Systems Review of Systems  Respiratory:  Positive for cough.   All other systems reviewed and are negative.   Physical Exam Triage Vital Signs ED Triage Vitals  Enc Vitals Group     BP 03/20/21 1156 (!) 143/95     Pulse Rate 03/20/21 1156 90     Resp 03/20/21 1156 17     Temp 03/20/21 1156 98.6 F (37 C)     Temp Source 03/20/21 1156 Oral     SpO2 03/20/21 1156 99 %     Weight --      Height --      Head Circumference --      Peak Flow --      Pain Score 03/20/21 1154 0     Pain Loc --      Pain Edu? --      Excl. in [place]? --    No  data found.  Updated Vital Signs BP (!) 143/95 (BP Location: Right Arm)    Pulse 90    Temp 98.6 F (37 C) (Oral)    Resp 17    SpO2 99%    Physical Exam Vitals and nursing note reviewed.  Constitutional:      General: She is not in acute distress.    Appearance: She is obese. She is not ill-appearing.  HENT:     Head: Normocephalic and atraumatic.     Right Ear: Tympanic membrane, ear canal and external ear normal.     Left Ear: Tympanic membrane, ear canal and external ear normal.     Mouth/Throat:     Mouth: Mucous membranes are moist.     Pharynx: Oropharynx is clear.  Eyes:     Extraocular Movements: Extraocular movements intact.     Conjunctiva/sclera: Conjunctivae normal.     Pupils: Pupils are equal, round, and reactive to light.  Cardiovascular:     Rate and Rhythm: Normal rate and regular rhythm.     Pulses: Normal pulses.     Heart sounds: Normal heart sounds.  Pulmonary:     Effort: Pulmonary effort is normal.     Comments: Mild infrequent audible wheeze, with decreased breath sounds bibasilarly, infrequent nonproductive cough noted on exam. Musculoskeletal:     Cervical back: Normal range of motion and neck supple.  Skin:    General: Skin is warm and dry.  Neurological:     General: No focal deficit present.     Mental Status: She is alert and oriented to person, place, and time. Mental status is at baseline.     UC Treatments / Results  Labs (all labs ordered are listed, but only abnormal results are displayed) Labs Reviewed - No data to display  EKG   Radiology No results found.  Procedures Procedures (including critical care time)  Medications Ordered in UC Medications - No data to display  Initial Impression / Assessment and Plan / UC Course  I have reviewed the  triage vital signs and the nursing notes.  Pertinent labs & imaging results that were available during my care of the patient were reviewed by me and considered in my medical  decision making (see chart for details).     MDM: 1.  Cough-Rx'd Cefdinir; 2.  Mild intermittent asthma with exacerbation-Rx'd Prednisone and refilled Symbicort. Advised patient to take medications as directed with food to completion.  Advised patient to take Prednisone with first dose of Cefdinir for 5 of next 7 days.  Advised patient we have refilled her Symbicort inhaler per her request.  Encouraged patient increase daily water intake while taking these medications.  Final Clinical Impressions(s) / UC Diagnoses   Final diagnoses:  Cough, unspecified type  Mild intermittent asthma with exacerbation     Discharge Instructions      Advised patient to take medications as directed with food to completion.  Advised patient to take Prednisone with first dose of Cefdinir for 5 of next 7 days.  Advised patient we have refilled her Symbicort inhaler per her request.  Encouraged patient increase daily water intake while taking these medications.     ED Prescriptions     Medication Sig Dispense Auth. Provider   budesonide-formoterol (SYMBICORT) 160-4.5 MCG/ACT inhaler Inhale 2 puffs into the lungs 2 times daily at 12 noon and 4 pm. 1 each Eliezer Lofts, FNP   predniSONE (DELTASONE) 20 MG tablet Take 3 tabs PO daily x 5 days. 15 tablet Eliezer Lofts, FNP   cefdinir (OMNICEF) 300 MG capsule Take 1 capsule (300 mg total) by mouth 2 (two) times daily for 7 days. 14 capsule Eliezer Lofts, FNP   fluconazole (DIFLUCAN) 150 MG tablet Take 1 tab p.o. for vaginal candidiasis, may repeat 1 tab p.o. in 3 days if symptoms are not resolved. 4 tablet Eliezer Lofts, FNP      PDMP not reviewed this encounter.   Eliezer Lofts, [place] 03/20/21 1252

## 2021-03-20 NOTE — ED Triage Notes (Signed)
Pt c/o cough x 1 week. Hx of asthma. Symbicort daily. New upstairs are smokers.

## 2021-10-23 ENCOUNTER — Encounter: Payer: Self-pay | Admitting: Emergency Medicine

## 2021-10-23 ENCOUNTER — Emergency Department
Admission: EM | Admit: 2021-10-23 | Discharge: 2021-10-23 | Disposition: A | Discharge status: Home / Self Care | Payer: Commercial Managed Care - PPO | Attending: Nurse Practitioner | Admitting: Nurse Practitioner

## 2021-10-23 DIAGNOSIS — R051 Acute cough: Secondary | ICD-10-CM

## 2021-10-23 DIAGNOSIS — J4541 Moderate persistent asthma with (acute) exacerbation: Secondary | ICD-10-CM | POA: Diagnosis not present

## 2021-10-23 DIAGNOSIS — Z01419 Encounter for gynecological examination (general) (routine) without abnormal findings: Secondary | ICD-10-CM

## 2021-10-23 MED ORDER — PREDNISONE 20 MG PO TABS: 40.0000 mg | ORAL_TABLET | Freq: Every day | ORAL | 0 refills | Status: AC

## 2021-10-23 MED ORDER — BUDESONIDE-FORMOTEROL FUMARATE 160-4.5 MCG/ACT IN AERO: 2.0000 | INHALATION_SPRAY | Freq: Two times a day (BID) | RESPIRATORY_TRACT | 0 refills | Status: DC

## 2021-10-23 MED ORDER — ALBUTEROL SULFATE HFA 108 (90 BASE) MCG/ACT IN AERS: 2.0000 | INHALATION_SPRAY | RESPIRATORY_TRACT | 0 refills | Status: DC | PRN

## 2021-10-23 NOTE — ED Triage Notes (Signed)
Pt has a history of asthma Cough & SOB the last couple of weeks  Pt has a new PCP appoint on 11/09/21  Needs a refill on symbicort  Also using albuterol  Denies fever  Busy life style - works full time while getting her masters degree

## 2021-10-23 NOTE — Discharge Instructions (Signed)
-   Please start the prednisone and take it daily in the morning for 5 days for the asthma exacerbation - Continue the Symbicort and use rescue inhaler as needed for wheezing or shortness of breath - Continue allergy regimen - Follow up with new PCP as planned

## 2021-10-23 NOTE — ED Provider Notes (Signed)
[name]    CSN: [id number] Arrival date & time: 10/23/21  1555      History   Chief Complaint Chief Complaint  Patient presents with   Shortness of Breath    HPI Alexandra Morgan is a 41 y.o. female.   Patient presents today for acute cough that has been ongoing for the past couple of weeks.  She also endorses some shortness of breath and wheezing.  Denies fevers, body aches, chills, pain with inspiration, frequent productive cough, back pain, decreased appetite, fatigue.  No ear pain or drainage, postnasal drainage, sore throat, chest pain, chest tightness, abdominal pain, nausea/vomiting, diarrhea, decreased appetite.  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  She is also in school and is very busy finishing up her masters degree.  She also is getting low on her Symbicort and albuterol inhalers which she uses for asthma.  She has been taking them as prescribed.  Has an appointment to establish care with a primary care provider within the next month.  Reports generally, the Symbicort controls her symptoms well.  She also takes medicine for allergies including loratadine and Flonase daily.    Past Medical History:  Diagnosis Date   Anemia    Asthma    Chlamydia 05/06/2014   Fibroid    HPV test positive 04/2014, 04/2015   normal cytology, negative subtype 16, 18/45. Recommend repeat Pap smear/HPV one year   Migraines     There are no problems to display for this patient.   Past Surgical History:  Procedure Laterality Date   ABDOMINAL SURGERY  05/10   ABDOMINAL MYOMECTOMY Dr Elby Showers   DENTAL SURGERY     INCISION AND DRAINAGE BREAST ABSCESS      OB History     Gravida  1   Para      Term      Preterm      AB  1   Living  0      SAB      IAB  1   Ectopic      Multiple      Live Births               Home Medications    Prior to Admission medications    Medication Sig Start Date End Date Taking? Authorizing Provider  predniSONE (DELTASONE) 20 MG tablet Take 2 tablets (40 mg total) by mouth daily with breakfast for 5 days. 10/23/21 10/28/21 Yes Eulogio Bear, NP  albuterol (VENTOLIN HFA) 108 (90 Base) MCG/ACT inhaler Inhale 2 puffs into the lungs every 4 (four) hours as needed for up to 1 day for wheezing or shortness of breath. 10/23/21 10/24/21  Eulogio Bear, NP  budesonide-formoterol [facility]) 160-4.5 MCG/ACT inhaler Inhale 2 puffs into the lungs 2 times daily at 12 noon and 4 pm. 10/23/21   Eulogio Bear, NP  cyclobenzaprine (FLEXERIL) 5 MG tablet Take 1 or 2 at bedtime to reduce muscle tension 02/10/21   Raylene Everts, MD  ferrous sulfate 325 (65 FE) MG tablet Take 325 mg by mouth daily with breakfast.    [provider]  fluticasone (FLONASE) 50 MCG/ACT nasal spray Place 2 sprays into both nostrils daily. 01/26/17   Joretta Bachelor, PA  ibuprofen (ADVIL,MOTRIN) 800 MG tablet Take one tablet by mouth every 8 hours as needed for cramping. 04/28/17   Fontaine, Christia Reading  P, MD  ketorolac (TORADOL) 10 MG tablet Take 1 tablet (10 mg total) by mouth every 6 (six) hours as needed. 02/10/21   Raylene Everts, MD  Lactobacillus Rhamnosus, GG, (CULTURELLE PO) Take by mouth.    [provider]    Family History Family History  Problem Relation Age of Onset   Heart disease Father    Hypertension Father    Diabetes Father    Stroke Father    Heart attack Father    Hypertension Sister    Diabetes Brother    Hypertension Brother    Cancer Mother        Bone   Stroke Maternal Grandfather     Social History Social History   Tobacco Use   Smoking status: Never   Smokeless tobacco: Never  Vaping Use   Vaping Use: Never used  Substance Use Topics   Alcohol use: Not Currently    Alcohol/week: 0.0 standard drinks of alcohol   Drug use: Never     Allergies   Patient has no known  allergies.   Review of Systems Review of Systems Per HPI  Physical Exam Triage Vital Signs ED Triage Vitals  Enc Vitals Group     BP --      Pulse Rate 10/23/21 1615 (!) 112     Resp 10/23/21 1615 18     Temp 10/23/21 1615 99.4 F (37.4 C)     Temp Source 10/23/21 1615 Oral     SpO2 10/23/21 1615 99 %     Weight 10/23/21 1624 (!) 317 lb (143.8 kg)     Height 10/23/21 1617 5' 7.5" (1.715 m)     Head Circumference --      Peak Flow --      Pain Score 10/23/21 1616 2     Pain Loc --      Pain Edu? --      Excl. in [place]? --    No data found.  Updated Vital Signs Pulse (!) 112   Temp 99.4 F (37.4 C) (Oral)   Resp 18   Ht 5' 7.5" (1.715 m)   Wt (!) 317 lb (143.8 kg)   LMP 10/08/2021 (Exact Date)   SpO2 99%   BMI 48.92 kg/m   Visual Acuity Right Eye Distance:   Left Eye Distance:   Bilateral Distance:    Right Eye Near:   Left Eye Near:    Bilateral Near:     Physical Exam Vitals and nursing note reviewed.  Constitutional:      General: She is not in acute distress.    Appearance: Normal appearance. She is obese. She is not toxic-appearing.  HENT:     Head: Normocephalic and atraumatic.     Right Ear: Tympanic membrane, ear canal and external ear normal. There is no impacted cerumen.     Left Ear: Tympanic membrane, ear canal and external ear normal. There is no impacted cerumen.     Nose: Nose normal. No congestion or rhinorrhea.     Mouth/Throat:     Mouth: Mucous membranes are moist.     Pharynx: Oropharynx is clear. No oropharyngeal exudate or posterior oropharyngeal erythema.  Eyes:     General: No scleral icterus.    Extraocular Movements: Extraocular movements intact.  Cardiovascular:     Rate and Rhythm: Normal rate and regular rhythm.  Pulmonary:     Effort: Pulmonary effort is normal. No respiratory distress.     Breath sounds: Normal breath  sounds. No wheezing, rhonchi or rales.  Musculoskeletal:     Cervical back: Normal range of motion.   Lymphadenopathy:     Cervical: No cervical adenopathy.  Skin:    General: Skin is warm and dry.     Capillary Refill: Capillary refill takes less than 2 seconds.     Coloration: Skin is not jaundiced or pale.     Findings: No erythema.  Neurological:     Mental Status: She is alert and oriented to person, place, and time.  Psychiatric:        Behavior: Behavior is cooperative.      UC Treatments / Results  Labs (all labs ordered are listed, but only abnormal results are displayed) Labs Reviewed - No data to display  EKG   Radiology No results found.  Procedures Procedures (including critical care time)  Medications Ordered in UC Medications - No data to display  Initial Impression / Assessment and Plan / UC Course  I have reviewed the triage vital signs and the nursing notes.  Pertinent labs & imaging results that were available during my care of the patient were reviewed by me and considered in my medical decision making (see chart for details).    Patient is a very pleasant, well-appearing 41 year old female presenting for cough and wheezing today.  Given history of asthma, will treat with prednisone 40 mg for 5 days asthma exacerbation.  Continue maintenance medication including Symbicort, allergy regimen.  No wheezing on examination, vital signs are stable including no tachypnea, and oxygen saturations well above 95% on room air..  Of note, we were unable to accurately obtain blood pressure today given the patient's arm size.  Encouraged patient to keep the appointment with a new primary care provider early next month, continue current medications and refill sent in.  The patient was given the opportunity to ask questions.  All questions answered to their satisfaction.  The patient is in agreement to this plan.   Final Clinical Impressions(s) / UC Diagnoses   Final diagnoses:  Acute cough  Moderate persistent asthma with acute exacerbation     Discharge  Instructions      - Please start the prednisone and take it daily in the morning for 5 days for the asthma exacerbation - Continue the Symbicort and use rescue inhaler as needed for wheezing or shortness of breath - Continue allergy regimen - Follow up with new PCP as planned     ED Prescriptions     Medication Sig Dispense Auth. Provider   albuterol (VENTOLIN HFA) 108 (90 Base) MCG/ACT inhaler Inhale 2 puffs into the lungs every 4 (four) hours as needed for up to 1 day for wheezing or shortness of breath. 6.7 g Noemi Chapel A, NP   budesonide-formoterol (SYMBICORT) 160-4.5 MCG/ACT inhaler Inhale 2 puffs into the lungs 2 times daily at 12 noon and 4 pm. 1 each Eulogio Bear, NP   predniSONE (DELTASONE) 20 MG tablet Take 2 tablets (40 mg total) by mouth daily with breakfast for 5 days. 10 tablet Eulogio Bear, NP      PDMP not reviewed this encounter.   Eulogio Bear, NP 10/23/21 1712

## 2021-10-24 ENCOUNTER — Telehealth: Payer: Self-pay | Admitting: Emergency Medicine

## 2021-10-24 NOTE — Telephone Encounter (Signed)
Call to Raneisha to see how she was doing today and if she had any questions regarding the visit. Pt thanked RN for the call, stated it was a great visit & very informative.

## 2021-11-10 NOTE — Progress Notes (Unsigned)
New Patient Office Visit  Subjective    Patient ID: Alexandra Morgan, female    DOB: 03-05-1981  Age: 41 y.o. MRN: [id number]  CC: No chief complaint on file.   HPI Alexandra Morgan presents to establish care ***  Outpatient Encounter Medications as of 11/11/2021  Medication Sig   albuterol (VENTOLIN HFA) 108 (90 Base) MCG/ACT inhaler Inhale 2 puffs into the lungs every 4 (four) hours as needed for up to 1 day for wheezing or shortness of breath.   budesonide-formoterol (SYMBICORT) 160-4.5 MCG/ACT inhaler Inhale 2 puffs into the lungs 2 times daily at 12 noon and 4 pm.   cyclobenzaprine (FLEXERIL) 5 MG tablet Take 1 or 2 at bedtime to reduce muscle tension   ferrous sulfate 325 (65 FE) MG tablet Take 325 mg by mouth daily with breakfast.   fluticasone (FLONASE) 50 MCG/ACT nasal spray Place 2 sprays into both nostrils daily.   ibuprofen (ADVIL,MOTRIN) 800 MG tablet Take one tablet by mouth every 8 hours as needed for cramping.   ketorolac (TORADOL) 10 MG tablet Take 1 tablet (10 mg total) by mouth every 6 (six) hours as needed.   Lactobacillus Rhamnosus, GG, (CULTURELLE PO) Take by mouth.   No facility-administered encounter medications on file as of 11/11/2021.    Past Medical History:  Diagnosis Date   Anemia    Asthma    Chlamydia 05/06/2014   Fibroid    HPV test positive 04/2014, 04/2015   normal cytology, negative subtype 16, 18/45. Recommend repeat Pap smear/HPV one year   Migraines     Past Surgical History:  Procedure Laterality Date   ABDOMINAL SURGERY  05/10   ABDOMINAL MYOMECTOMY Dr Elby Showers   DENTAL SURGERY     INCISION AND DRAINAGE BREAST ABSCESS      Family History  Problem Relation Age of Onset   Heart disease Father    Hypertension Father    Diabetes Father    Stroke Father    Heart attack Father    Hypertension Sister    Diabetes Brother    Hypertension Brother    Cancer Mother        Bone   Stroke Maternal Grandfather     Social History    Socioeconomic History   Marital status: Single    Spouse name: Not on file   Number of children: Not on file   Years of education: Not on file   Highest education level: Not on file  Occupational History   Not on file  Tobacco Use   Smoking status: Never   Smokeless tobacco: Never  Vaping Use   Vaping Use: Never used  Substance and Sexual Activity   Alcohol use: Not Currently    Alcohol/week: 0.0 standard drinks of alcohol   Drug use: Never   Sexual activity: Not on file  Other Topics Concern   Not on file  Social History Narrative   Not on file   Social Determinants of Health   Financial Resource Strain: Not on file  Food Insecurity: Not on file  Transportation Needs: Not on file  Physical Activity: Not on file  Stress: Not on file  Social Connections: Not on file  Intimate Partner Violence: Not on file    ROS      Objective    LMP 10/08/2021 (Exact Date)   Physical Exam  {Labs (Optional):23779}    Assessment & Plan:   Problem List Items Addressed This Visit   None   No follow-ups on file.  Owens Loffler, DO

## 2021-11-11 ENCOUNTER — Ambulatory Visit (INDEPENDENT_AMBULATORY_CARE_PROVIDER_SITE_OTHER): Payer: Commercial Managed Care - PPO | Admitting: Family Medicine

## 2021-11-11 DIAGNOSIS — R7303 Prediabetes: Secondary | ICD-10-CM | POA: Insufficient documentation

## 2021-11-11 DIAGNOSIS — Z1231 Encounter for screening mammogram for malignant neoplasm of breast: Secondary | ICD-10-CM | POA: Diagnosis not present

## 2021-11-11 DIAGNOSIS — J4540 Moderate persistent asthma, uncomplicated: Secondary | ICD-10-CM

## 2021-11-11 DIAGNOSIS — D509 Iron deficiency anemia, unspecified: Secondary | ICD-10-CM | POA: Diagnosis not present

## 2021-11-11 DIAGNOSIS — N644 Mastodynia: Secondary | ICD-10-CM

## 2021-11-11 MED ORDER — BUDESONIDE-FORMOTEROL FUMARATE 160-4.5 MCG/ACT IN AERO: 2.0000 | INHALATION_SPRAY | Freq: Two times a day (BID) | RESPIRATORY_TRACT | 0 refills | Status: DC

## 2021-11-11 MED ORDER — ALBUTEROL SULFATE HFA 108 (90 BASE) MCG/ACT IN AERS: 2.0000 | INHALATION_SPRAY | RESPIRATORY_TRACT | 0 refills | Status: DC | PRN

## 2021-11-11 NOTE — Assessment & Plan Note (Signed)
-   have ordered mammogram - breast exam was normal with chaperone present

## 2021-11-11 NOTE — Assessment & Plan Note (Signed)
-   pt reports consistency with cycle changes as this could be hormonal  - breast exam normal  - screening mammo ordered for routine breast cancer screening

## 2021-11-11 NOTE — Assessment & Plan Note (Addendum)
-   have sent in symbicort. Pt says she has tried advair in the past and it did not work for her with the powder and she felt like she reacted differently to it. She said currently insurance does not cover symbicort. Told her to let me know and we can try and submit a prior auth - can also consider if insurance covers advair HFA (since it sounds like the diskus is what was bothering her) - also sent refills for albuterol inhaler

## 2021-11-11 NOTE — Assessment & Plan Note (Signed)
-   per epic chart review shows most recent a1c in March 2022 is 6.3 will repeat today. Pt is wanting to do diet and exercise changes prior to starting medication. We discussed that pending how high her A1C levels are we may have to start with oral therapy if she is >7

## 2021-11-11 NOTE — Assessment & Plan Note (Signed)
-   pt has hx of IDA and is on otc iron supplements. Will order CBC to monitor levels and see if anything needs to be adjusted.

## 2021-11-11 NOTE — Assessment & Plan Note (Addendum)
-   Vit D ordered to check if pt has Vit D deficiency  - counseled on diet and exercise - also discussed possible sleep apnea testing. Pt unsure if she snores as she lives alone but does admit to daytime fatigue. She would like to hold off on testing at this point due to busy life with work and school.

## 2021-11-12 ENCOUNTER — Other Ambulatory Visit: Payer: Self-pay | Admitting: Family Medicine

## 2021-11-12 DIAGNOSIS — E559 Vitamin D deficiency, unspecified: Secondary | ICD-10-CM

## 2021-11-12 LAB — LIPID PANEL
Cholesterol: 203 mg/dL — ABNORMAL HIGH (ref <200)
HDL: 63 mg/dL (ref >=50)
LDL Cholesterol (Calc): 124 mg/dL (calc) — ABNORMAL HIGH
Non-HDL Cholesterol (Calc): 140 mg/dL (calc) — ABNORMAL HIGH (ref <130)
Total CHOL/HDL Ratio: 3.2 (calc) (ref <5.0)
Triglycerides: 69 mg/dL (ref <150)

## 2021-11-12 LAB — CBC
HCT: 35.5 % (ref 35.0–45.0)
Hemoglobin: 11.3 g/dL — ABNORMAL LOW (ref 11.7–15.5)
MCH: 25.6 pg — ABNORMAL LOW (ref 27.0–33.0)
MCHC: 31.8 g/dL — ABNORMAL LOW (ref 32.0–36.0)
MCV: 80.5 fL (ref 80.0–100.0)
MPV: 10.3 fL (ref 7.5–12.5)
Platelets: 343 10*3/uL (ref 140–400)
RBC: 4.41 10*6/uL (ref 3.80–5.10)
RDW: 13.9 % (ref 11.0–15.0)
WBC: 5.9 10*3/uL (ref 3.8–10.8)

## 2021-11-12 LAB — HEMOGLOBIN A1C
Hgb A1c MFr Bld: 6.8 % of total Hgb — ABNORMAL HIGH (ref <5.7)
Mean Plasma Glucose: 148 mg/dL
eAG (mmol/L): 8.2 mmol/L

## 2021-11-12 LAB — VITAMIN D 25 HYDROXY (VIT D DEFICIENCY, FRACTURES): Vit D, 25-Hydroxy: 11 ng/mL — ABNORMAL LOW (ref 30–100)

## 2021-11-12 MED ORDER — CHOLECALCIFEROL 1.25 MG (50000 UT) PO CAPS: 50000.0000 [IU] | ORAL_CAPSULE | ORAL | 0 refills | Status: DC

## 2022-02-11 ENCOUNTER — Ambulatory Visit: Payer: Commercial Managed Care - PPO | Admitting: Family Medicine

## 2022-05-01 ENCOUNTER — Other Ambulatory Visit: Payer: Self-pay

## 2022-05-01 ENCOUNTER — Ambulatory Visit
Admission: EM | Admit: 2022-05-01 | Discharge: 2022-05-01 | Disposition: A | Discharge status: Home / Self Care | Payer: Commercial Managed Care - PPO | Attending: Emergency Medicine | Admitting: Emergency Medicine

## 2022-05-01 DIAGNOSIS — J4541 Moderate persistent asthma with (acute) exacerbation: Secondary | ICD-10-CM

## 2022-05-01 DIAGNOSIS — J3089 Other allergic rhinitis: Secondary | ICD-10-CM | POA: Diagnosis not present

## 2022-05-01 DIAGNOSIS — J302 Other seasonal allergic rhinitis: Secondary | ICD-10-CM | POA: Diagnosis not present

## 2022-05-01 DIAGNOSIS — J329 Chronic sinusitis, unspecified: Secondary | ICD-10-CM | POA: Diagnosis not present

## 2022-05-01 MED ORDER — BUDESONIDE-FORMOTEROL FUMARATE 160-4.5 MCG/ACT IN AERO: 2.0000 | INHALATION_SPRAY | Freq: Two times a day (BID) | RESPIRATORY_TRACT | 1 refills | Status: DC

## 2022-05-01 MED ORDER — FLUTICASONE PROPIONATE 50 MCG/ACT NA SUSP: 1.0000 | Freq: Every day | NASAL | 1 refills | Status: DC

## 2022-05-01 MED ORDER — LORATADINE 10 MG PO TABS: 10.0000 mg | ORAL_TABLET | Freq: Every day | ORAL | 1 refills | Status: DC

## 2022-05-01 MED ORDER — CEFDINIR 300 MG PO CAPS: 300.0000 mg | ORAL_CAPSULE | Freq: Two times a day (BID) | ORAL | 0 refills | Status: AC

## 2022-05-01 MED ORDER — ALBUTEROL SULFATE HFA 108 (90 BASE) MCG/ACT IN AERS: 2.0000 | INHALATION_SPRAY | Freq: Four times a day (QID) | RESPIRATORY_TRACT | 2 refills | Status: DC | PRN

## 2022-05-01 MED ORDER — ALBUTEROL SULFATE (2.5 MG/3ML) 0.083% IN NEBU
2.5000 mg | INHALATION_SOLUTION | Freq: Once | RESPIRATORY_TRACT | Status: AC
  Administered 2022-05-01: 2.5 mg via RESPIRATORY_TRACT

## 2022-05-01 MED ORDER — METHYLPREDNISOLONE 8 MG PO TABS: 16.0000 mg | ORAL_TABLET | Freq: Every day | ORAL | 0 refills | Status: AC

## 2022-05-01 NOTE — Discharge Instructions (Signed)
Please read below to learn more about the medications, dosages and frequencies that I recommend to help alleviate your symptoms and to get you feeling better soon:   Omnicef (cefdinir):  1 capsule twice daily for 10 days, you can take it with or without food.  This antibiotic can cause upset stomach, this will resolve once antibiotics are complete.  You are welcome to use a probiotic, eat yogurt, take Imodium while taking this medication.  Please avoid other systemic medications such as Maalox, Pepto-Bismol or milk of magnesia as they can interfere with your body's ability to absorb the antibiotics.       Medrol (methylprednisolone): This is a steroid that will significantly calm your upper and lower airways, please take the daily recommended quantity of tablets daily with your breakfast meal starting tomorrow morning until the prescription is complete.      Claritin (loratadine): This is an excellent second-generation antihistamine that helps to reduce respiratory inflammatory response to environmental allergens.  This medication is not known to cause daytime sleepiness so it can be taken in the daytime.  If you find that it does make you sleepy, please feel free to take it at bedtime.   Flonase (fluticasone): This is a steroid nasal spray that you use once daily, 1 spray in each nare.  This medication does not work well if you decide to use it only used as you feel you need to, it works best used on a daily basis.  After 3 to 5 days of use, you will notice significant reduction of the inflammation and mucus production that is currently being caused by exposure to allergens, whether seasonal or environmental.  The most common side effect of this medication is nosebleeds.  If you experience a nosebleed, please discontinue use for 1 week, then feel free to resume.  I have provided you with a prescription.     ProAir, Ventolin, Proventil (albuterol): This inhaled medication contains a short acting beta  agonist bronchodilator.  This medication works on the smooth muscle that opens and constricts of your airways by relaxing the muscle.  The result of relaxation of the smooth muscle is increased air movement and improved work of breathing.  This is a short acting medication that can be used every 4-6 hours as needed for increased work of breathing, shortness of breath, wheezing and excessive coughing.  I have provided you with a prescription.    Symbicort (budesonide and formoterol): Please inhale 2 puffs twice daily.  This inhaled medication contains a corticosteroid and long-acting form of albuterol.  The inhaled steroid and this medication  is not absorbed into the body and will not cause side effects such as increased blood sugar levels, irritability, sleeplessness or weight gain.  Inhaled corticosteroid are sort of like topical steroid creams but, as you can imagine, it is not practical to attempt to rub a steroid cream inside of your lungs.  The long-acting albuterol works similarly to the short acting albuterol found in your rescue inhaler but provides 24-hour relaxation of the smooth muscles that open and constrict your airways; your short acting rescue inhaler can only provide for a few hours this benefit for a few hours.  Please feel free to continue using your short acting rescue inhaler as often as needed throughout the day for shortness of breath, wheezing, and cough.    Please follow-up within the next 5-7 days either with your primary care provider or urgent care if your symptoms do not resolve.  If  you do not have a primary care provider, we will assist you in finding one.        Thank you for visiting urgent care today.  We appreciate the opportunity to participate in your care.

## 2022-05-01 NOTE — ED Provider Notes (Signed)
[name]    CSN: [id number] Arrival date & time: 05/01/22  1411    HISTORY   Chief Complaint  Patient presents with   Nasal Congestion   HPI Alexandra Morgan is a pleasant, 42 y.o. female who presents to urgent care today. Patient complains of nasal congestion, rhinorrhea with small streaks of blood, nonproductive, persistent cough, scratchy throat and headache for the past week.  Patient has normal vital signs on arrival today.  Has a history of asthma and allergies, does not have any active prescriptions for allergy or asthma medications but states she has been inhaling Symbicort and taking Flonase daily since her symptoms began.  Last prescription for both was written August 2023.  Patient states she has been coughing a lot but has not used her albuterol inhaler to see if this will help.  Patient states she does not regularly take an antihistamine because Zyrtec makes her sleepy, states she did begin taking Claritin a few days ago but has not seen any meaningful improvement of her symptoms.  Patient states she is concerned she may have a sinus infection.  The history is provided by the patient.   Past Medical History:  Diagnosis Date   Anemia    Asthma    Chlamydia 05/06/2014   Fibroid    HPV test positive 04/2014, 04/2015   normal cytology, negative subtype 16, 18/45. Recommend repeat Pap smear/HPV one year   Migraines    Patient Active Problem List   Diagnosis Date Noted   Morbid obesity ([place]) 11/11/2021   Prediabetes 11/11/2021   Iron deficiency anemia 11/11/2021   Screening mammogram for breast cancer 11/11/2021   Moderate persistent asthma without complication 40/01/2724   Breast pain 11/11/2021   Past Surgical History:  Procedure Laterality Date   ABDOMINAL SURGERY  05/10   ABDOMINAL MYOMECTOMY Dr Elby Showers   DENTAL SURGERY     INCISION AND DRAINAGE BREAST ABSCESS     OB History     Gravida  1   Para      Term      Preterm      AB  1   Living   0      SAB      IAB  1   Ectopic      Multiple      Live Births             Home Medications    Prior to Admission medications   Medication Sig Start Date End Date Taking? Authorizing Provider  albuterol (VENTOLIN HFA) 108 (90 Base) MCG/ACT inhaler Inhale 2 puffs into the lungs every 4 (four) hours as needed for wheezing or shortness of breath. 11/11/21   Owens Loffler, DO  budesonide-formoterol (SYMBICORT) 160-4.5 MCG/ACT inhaler Inhale 2 puffs into the lungs 2 times daily at 12 noon and 4 pm. 11/11/21   Owens Loffler, DO  Cholecalciferol 1.25 MG (50000 UT) capsule Take 1 capsule (50,000 Units total) by mouth once a week. 11/12/21   Owens Loffler, DO  cyclobenzaprine (FLEXERIL) 5 MG tablet Take 1 or 2 at bedtime to reduce muscle tension Patient not taking: Reported on 11/11/2021 02/10/21   Raylene Everts, MD  ferrous sulfate 325 (65 FE) MG tablet Take 325 mg by mouth daily with breakfast.    [provider]  fluticasone (FLONASE) 50 MCG/ACT nasal spray Place 2 sprays into both nostrils daily. Patient not taking: Reported on 11/11/2021 01/26/17   Joretta Bachelor, [place]  ibuprofen (ADVIL,MOTRIN) 800 MG tablet Take one tablet by mouth every 8 hours as needed for cramping. 04/28/17   Fontaine, Belinda Block, MD  ketorolac (TORADOL) 10 MG tablet Take 1 tablet (10 mg total) by mouth every 6 (six) hours as needed. Patient not taking: Reported on 11/11/2021 02/10/21   Raylene Everts, MD  Lactobacillus Rhamnosus, GG, (CULTURELLE PO) Take by mouth.    [provider]    Family History Family History  Problem Relation Age of Onset   Heart disease Father    Hypertension Father    Diabetes Father    Stroke Father    Heart attack Father    Hypertension Sister    Diabetes Brother    Hypertension Brother    Cancer Mother        Bone   Stroke Maternal Grandfather    Social History Social History   Tobacco Use   Smoking status: Never   Smokeless tobacco: Never   Vaping Use   Vaping Use: Never used  Substance Use Topics   Alcohol use: Not Currently    Alcohol/week: 0.0 standard drinks of alcohol   Drug use: Never   Allergies   Patient has no known allergies.  Review of Systems Review of Systems Pertinent findings revealed after performing a 14 point review of systems has been noted in the history of present illness.  Physical Exam Triage Vital Signs ED Triage Vitals  Enc Vitals Group     BP 01/30/21 0827 (!) 147/82     Pulse Rate 01/30/21 0827 72     Resp 01/30/21 0827 18     Temp 01/30/21 0827 98.3 F (36.8 C)     Temp Source 01/30/21 0827 Oral     SpO2 01/30/21 0827 98 %     Weight --      Height --      Head Circumference --      Peak Flow --      Pain Score 01/30/21 0826 5     Pain Loc --      Pain Edu? --      Excl. in [place]? --   No data found.  Updated Vital Signs BP 129/87 (BP Location: Right Arm)   Pulse 100   Temp 99 F (37.2 C) (Oral)   Resp 18   Wt (!) 319 lb (144.7 kg)   SpO2 96%   BMI 49.96 kg/m   Physical Exam Vitals and nursing note reviewed.  Constitutional:      General: She is not in acute distress.    Appearance: Normal appearance. She is not ill-appearing.  HENT:     Head: Normocephalic and atraumatic.     Salivary Glands: Right salivary gland is not diffusely enlarged or tender. Left salivary gland is not diffusely enlarged or tender.     Right Ear: Ear canal and external ear normal. No drainage. A middle ear effusion is present. There is no impacted cerumen. Tympanic membrane is bulging. Tympanic membrane is not injected or erythematous.     Left Ear: Ear canal and external ear normal. No drainage. A middle ear effusion is present. There is no impacted cerumen. Tympanic membrane is bulging. Tympanic membrane is not injected or erythematous.     Ears:     Comments: Bilateral EACs normal, both TMs bulging with clear fluid    Nose: Rhinorrhea present. No nasal deformity, septal deviation, signs of  injury, nasal tenderness, mucosal edema or congestion. Rhinorrhea is clear.  Right Nostril: Occlusion present. No foreign body, epistaxis or septal hematoma.     Left Nostril: Occlusion present. No foreign body, epistaxis or septal hematoma.     Right Turbinates: Enlarged, swollen and pale.     Left Turbinates: Enlarged, swollen and pale.     Right Sinus: No maxillary sinus tenderness or frontal sinus tenderness.     Left Sinus: No maxillary sinus tenderness or frontal sinus tenderness.     Mouth/Throat:     Lips: Pink. No lesions.     Mouth: Mucous membranes are moist. No oral lesions.     Pharynx: Oropharynx is clear. Uvula midline. No posterior oropharyngeal erythema or uvula swelling.     Tonsils: No tonsillar exudate. 0 on the right. 0 on the left.     Comments: Postnasal drip Eyes:     General: Lids are normal.        Right eye: No discharge.        Left eye: No discharge.     Extraocular Movements: Extraocular movements intact.     Conjunctiva/sclera: Conjunctivae normal.     Right eye: Right conjunctiva is not injected.     Left eye: Left conjunctiva is not injected.  Neck:     Trachea: Trachea and phonation normal.  Cardiovascular:     Rate and Rhythm: Normal rate and regular rhythm.     Pulses: Normal pulses.     Heart sounds: Normal heart sounds. No murmur heard.    No friction rub. No gallop.  Pulmonary:     Effort: Pulmonary effort is normal. No tachypnea, bradypnea, accessory muscle usage, prolonged expiration or respiratory distress.     Breath sounds: No stridor, decreased air movement or transmitted upper airway sounds. Examination of the right-middle field reveals decreased breath sounds. Examination of the left-middle field reveals decreased breath sounds. Examination of the right-lower field reveals decreased breath sounds. Examination of the left-lower field reveals decreased breath sounds. Decreased breath sounds present. No wheezing, rhonchi or rales.  Chest:      Chest wall: No tenderness.  Musculoskeletal:        General: Normal range of motion.     Cervical back: Normal range of motion and neck supple. Normal range of motion.  Lymphadenopathy:     Cervical: No cervical adenopathy.  Skin:    General: Skin is warm and dry.     Findings: No erythema or rash.  Neurological:     General: No focal deficit present.     Mental Status: She is alert and oriented to person, place, and time.  Psychiatric:        Mood and Affect: Mood normal.        Behavior: Behavior normal.     Visual Acuity Right Eye Distance:   Left Eye Distance:   Bilateral Distance:    Right Eye Near:   Left Eye Near:    Bilateral Near:     UC Couse / Diagnostics / Procedures:     Radiology No results found.  Procedures Procedures (including critical care time) EKG  Pending results:  Labs Reviewed - No data to display  Medications Ordered in UC: Medications  albuterol (PROVENTIL) (2.5 MG/3ML) 0.083% nebulizer solution 2.5 mg (2.5 mg Nebulization Given 05/01/22 1453)    UC Diagnoses / Final Clinical Impressions(s)   I have reviewed the triage vital signs and the nursing notes.  Pertinent labs & imaging results that were available during my care of the patient were reviewed by me  and considered in my medical decision making (see chart for details).    Final diagnoses:  Moderate persistent asthma with acute exacerbation  Rhinosinusitis  Perennial allergic rhinitis with seasonal variation   Patient did not have meaningful improvement of breath sounds after nebulized albuterol treatment but cough did improve in quality and productivity.  Patient was provided with renewal of all of her asthma and allergy medications as she has been having difficulty getting refills from her regular provider.  Patient also advised that I recommend a 3-day course of steroids and, secondary to taking steroids, a 10-day course of cefdinir for empiric treatment of probable bacterial  sinusitis.  Return precautions advised.  Patient encouraged to follow-up with her PCP soon as possible.  Please see discharge instructions below for further details of plan of care as provided to patient. ED Prescriptions     Medication Sig Dispense Auth. Provider   budesonide-formoterol (SYMBICORT) 160-4.5 MCG/ACT inhaler Inhale 2 puffs into the lungs every 12 (twelve) hours. 3 each Lynden Oxford Scales, PA-C   fluticasone (FLONASE) 50 MCG/ACT nasal spray Place 1 spray into both nostrils daily. 47.4 mL Lynden Oxford Scales, PA-C   loratadine (CLARITIN) 10 MG tablet Take 1 tablet (10 mg total) by mouth daily. 90 tablet Lynden Oxford Scales, PA-C   cefdinir (OMNICEF) 300 MG capsule Take 1 capsule (300 mg total) by mouth 2 (two) times daily for 10 days. 20 capsule Lynden Oxford Scales, PA-C   albuterol (VENTOLIN HFA) 108 (90 Base) MCG/ACT inhaler Inhale 2 puffs into the lungs every 6 (six) hours as needed for wheezing or shortness of breath (Cough). 54 g Lynden Oxford Scales, PA-C   methylPREDNISolone (MEDROL) 8 MG tablet Take 2 tablets (16 mg total) by mouth daily for 3 days. 6 tablet Lynden Oxford Scales, PA-C      PDMP not reviewed this encounter.  Disposition Upon Discharge:  Condition: stable for discharge home Home: take medications as prescribed; routine discharge instructions as discussed; follow up as advised.  Patient presented with an acute illness with associated systemic symptoms and significant discomfort requiring urgent management. In my opinion, this is a condition that a prudent lay person (someone who possesses an average knowledge of health and medicine) may potentially expect to result in complications if not addressed urgently such as respiratory distress, impairment of bodily function or dysfunction of bodily organs.   Routine symptom specific, illness specific and/or disease specific instructions were discussed with the patient and/or caregiver at length.    As such, the patient has been evaluated and assessed, work-up was performed and treatment was provided in alignment with urgent care protocols and evidence based medicine.  Patient/parent/caregiver has been advised that the patient may require follow up for further testing and treatment if the symptoms continue in spite of treatment, as clinically indicated and appropriate.  If the patient was tested for COVID-19, Influenza and/or RSV, then the patient/parent/guardian was advised to isolate at home pending the results of his/her diagnostic coronavirus test and potentially longer if they're positive. I have also advised pt that if his/her COVID-19 test returns positive, it's recommended to self-isolate for at least 10 days after symptoms first appeared AND until fever-free for 24 hours without fever reducer AND other symptoms have improved or resolved. Discussed self-isolation recommendations as well as instructions for household member/close contacts as per the [facility] and [place] DH[facility], and also gave patient the [facility] packet with this information.  Patient/parent/caregiver has been advised to return to the [facility] or PCP in  3-5 days if no better; to PCP or the Emergency Department if new signs and symptoms develop, or if the current signs or symptoms continue to change or worsen for further workup, evaluation and treatment as clinically indicated and appropriate  The patient will follow up with their current PCP if and as advised. If the patient does not currently have a PCP we will assist them in obtaining one.   The patient may need specialty follow up if the symptoms continue, in spite of conservative treatment and management, for further workup, evaluation, consultation and treatment as clinically indicated and appropriate.  Patient/parent/caregiver verbalized understanding and agreement of plan as discussed.  All questions were addressed during visit.  Please see discharge instructions below for further  details of plan.  Discharge Instructions:   Discharge Instructions      Please read below to learn more about the medications, dosages and frequencies that I recommend to help alleviate your symptoms and to get you feeling better soon:   Omnicef (cefdinir):  1 capsule twice daily for 10 days, you can take it with or without food.  This antibiotic can cause upset stomach, this will resolve once antibiotics are complete.  You are welcome to use a probiotic, eat yogurt, take Imodium while taking this medication.  Please avoid other systemic medications such as Maalox, Pepto-Bismol or milk of magnesia as they can interfere with your body's ability to absorb the antibiotics.       Medrol (methylprednisolone): This is a steroid that will significantly calm your upper and lower airways, please take the daily recommended quantity of tablets daily with your breakfast meal starting tomorrow morning until the prescription is complete.      Claritin (loratadine): This is an excellent second-generation antihistamine that helps to reduce respiratory inflammatory response to environmental allergens.  This medication is not known to cause daytime sleepiness so it can be taken in the daytime.  If you find that it does make you sleepy, please feel free to take it at bedtime.   Flonase (fluticasone): This is a steroid nasal spray that you use once daily, 1 spray in each nare.  This medication does not work well if you decide to use it only used as you feel you need to, it works best used on a daily basis.  After 3 to 5 days of use, you will notice significant reduction of the inflammation and mucus production that is currently being caused by exposure to allergens, whether seasonal or environmental.  The most common side effect of this medication is nosebleeds.  If you experience a nosebleed, please discontinue use for 1 week, then feel free to resume.  I have provided you with a prescription.     ProAir, Ventolin,  Proventil (albuterol): This inhaled medication contains a short acting beta agonist bronchodilator.  This medication works on the smooth muscle that opens and constricts of your airways by relaxing the muscle.  The result of relaxation of the smooth muscle is increased air movement and improved work of breathing.  This is a short acting medication that can be used every 4-6 hours as needed for increased work of breathing, shortness of breath, wheezing and excessive coughing.  I have provided you with a prescription.    Symbicort (budesonide and formoterol): Please inhale 2 puffs twice daily.  This inhaled medication contains a corticosteroid and long-acting form of albuterol.  The inhaled steroid and this medication  is not absorbed into the body and will not cause side  effects such as increased blood sugar levels, irritability, sleeplessness or weight gain.  Inhaled corticosteroid are sort of like topical steroid creams but, as you can imagine, it is not practical to attempt to rub a steroid cream inside of your lungs.  The long-acting albuterol works similarly to the short acting albuterol found in your rescue inhaler but provides 24-hour relaxation of the smooth muscles that open and constrict your airways; your short acting rescue inhaler can only provide for a few hours this benefit for a few hours.  Please feel free to continue using your short acting rescue inhaler as often as needed throughout the day for shortness of breath, wheezing, and cough.    Please follow-up within the next 5-7 days either with your primary care provider or urgent care if your symptoms do not resolve.  If you do not have a primary care provider, we will assist you in finding one.        Thank you for visiting urgent care today.  We appreciate the opportunity to participate in your care.       This office note has been dictated using Museum/gallery curator.  Unfortunately, this method of dictation can  sometimes lead to typographical or grammatical errors.  I apologize for your inconvenience in advance if this occurs.  Please do not hesitate to reach out to me if clarification is needed.      Lynden Oxford Scales, PA-C 05/01/22 1513

## 2022-05-01 NOTE — ED Triage Notes (Signed)
Nasal congestion, cough, sore throat and headache onset for a week

## 2022-11-25 ENCOUNTER — Encounter: Payer: Self-pay | Admitting: *Deleted

## 2022-11-25 ENCOUNTER — Other Ambulatory Visit: Payer: Self-pay

## 2022-11-25 ENCOUNTER — Ambulatory Visit
Admission: EM | Admit: 2022-11-25 | Discharge: 2022-11-25 | Disposition: A | Discharge status: Home / Self Care | Payer: Commercial Managed Care - PPO | Attending: Urgent Care | Admitting: Urgent Care

## 2022-11-25 DIAGNOSIS — L83 Acanthosis nigricans: Secondary | ICD-10-CM

## 2022-11-25 DIAGNOSIS — M7918 Myalgia, other site: Secondary | ICD-10-CM | POA: Diagnosis not present

## 2022-11-25 MED ORDER — CARISOPRODOL 350 MG PO TABS: 350.0000 mg | ORAL_TABLET | Freq: Three times a day (TID) | ORAL | 0 refills | Status: DC

## 2022-11-25 MED ORDER — DICLOFENAC SODIUM 75 MG PO TBEC: 75.0000 mg | DELAYED_RELEASE_TABLET | Freq: Two times a day (BID) | ORAL | 0 refills | Status: AC
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
## 2022-11-25 NOTE — Discharge Instructions (Addendum)
You have myofascial pain, which is knots in the muscles of the neck and shoulders.  Please apply a warm moist compress, such as a microwavable heating pack, to your neck and shoulders several times daily. After each warm compress, apply a deep pressure to the affected areas, called ischemic release. This is a prolonged, deep pressure into the knot of the muscle to release the tension.  Take the muscle relaxer (soma) three times daily as needed. Keep in mind it may make you feel tired or drowsy, so do not operate machinery or drive a car until you know how it affects you.  Please take the anti-inflammatory medication called in today twice daily with food. Do not take any additional OTC NSAIDS (advil, motrin, ibuprofen, aleve, naproxen).    Try to stay hydrated with WATER as dehydration and caffeine intake can worsen this condition.  Try a warm Epsom salt bath; this can alleviate muscle tension and pain.  If you develop worsening pain, fever, or any new symptoms, please return to the clinic for recheck.  Regarding your blood pressure, it is elevated in office. Acute pain and stress can cause elevated blood pressure readings. Please monitor your blood pressure daily at home and record. If your readings remain elevated (>120/80), please follow up with PCP to address. Please also follow up to discuss possibility of insulin resistance.
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
## 2023-06-16 ENCOUNTER — Ambulatory Visit
Admission: EM | Admit: 2023-06-16 | Discharge: 2023-06-16 | Disposition: A | Discharge status: Home / Self Care | Attending: Family Medicine | Admitting: Family Medicine

## 2023-06-16 DIAGNOSIS — U071 COVID-19: Secondary | ICD-10-CM | POA: Diagnosis not present

## 2023-06-16 DIAGNOSIS — R059 Cough, unspecified: Secondary | ICD-10-CM

## 2023-06-16 MED ORDER — BENZONATATE 200 MG PO CAPS: 200.0000 mg | ORAL_CAPSULE | Freq: Three times a day (TID) | ORAL | 0 refills | Status: AC | PRN

## 2023-06-16 MED ORDER — PREDNISONE 20 MG PO TABS: ORAL_TABLET | ORAL | 0 refills | Status: DC

## 2023-06-16 MED ORDER — HYDROCODONE BIT-HOMATROP MBR 5-1.5 MG/5ML PO SOLN: 5.0000 mL | Freq: Four times a day (QID) | ORAL | 0 refills | Status: DC | PRN

## 2023-06-16 MED ORDER — PAXLOVID (300/100) 20 X 150 MG & 10 X 100MG PO TBPK: 3.0000 | ORAL_TABLET | Freq: Two times a day (BID) | ORAL | 0 refills | Status: AC

## 2023-06-16 NOTE — ED Provider Notes (Signed)
 [facility]    CSN: [id number] Arrival date & time: 06/16/23  1107      History   Chief Complaint Chief Complaint  Patient presents with   URI   Covid Positive    HPI Alexandra Morgan is a 43 y.o. female.   HPI Pleasant 43 year old female presents with positive COVID-19 test taken at work earlier this morning.  PMH significant for morbid obesity, iron deficiency anemia, migraines.  Past Medical History:  Diagnosis Date   Anemia    Asthma    Chlamydia 05/06/2014   Fibroid    HPV test positive 04/2014, 04/2015   normal cytology, negative subtype 16, 18/45. Recommend repeat Pap smear/HPV one year   Migraines     Patient Active Problem List   Diagnosis Date Noted   Morbid obesity (HCC) 11/11/2021   Prediabetes 11/11/2021   Iron deficiency anemia 11/11/2021   Screening mammogram for breast cancer 11/11/2021   Moderate persistent asthma without complication 11/11/2021   Breast pain 11/11/2021    Past Surgical History:  Procedure Laterality Date   ABDOMINAL SURGERY  05/10   ABDOMINAL MYOMECTOMY Dr Genevive Bi   DENTAL SURGERY     INCISION AND DRAINAGE BREAST ABSCESS      OB History     Gravida  1   Para      Term      Preterm      AB  1   Living  0      SAB      IAB  1   Ectopic      Multiple      Live Births               Home Medications    Prior to Admission medications   Medication Sig Start Date End Date Taking? Authorizing Provider  benzonatate (TESSALON) 200 MG capsule Take 1 capsule (200 mg total) by mouth 3 (three) times daily as needed for up to 7 days. 06/16/23 06/23/23 Yes Trevor Iha, FNP  HYDROcodone bit-homatropine (HYCODAN) 5-1.5 MG/5ML syrup Take 5 mLs by mouth every 6 (six) hours as needed for cough. 06/16/23  Yes Trevor Iha, FNP  nirmatrelvir/ritonavir (PAXLOVID, 300/100,) 20 x 150 MG & 10 x 100MG  TBPK Take 3 tablets by mouth 2 (two) times daily for 5 days. Patient GFR is >60. Take nirmatrelvir (150 mg) two  tablets twice daily for 5 days and ritonavir (100 mg) one tablet twice daily for 5 days. 06/16/23 06/21/23 Yes Trevor Iha, FNP  predniSONE (DELTASONE) 20 MG tablet Take 3 tabs PO daily x 5 days. 06/16/23  Yes Trevor Iha, FNP  albuterol (VENTOLIN HFA) 108 (90 Base) MCG/ACT inhaler Inhale 2 puffs into the lungs every 6 (six) hours as needed for wheezing or shortness of breath (Cough). 05/01/22   Theadora Rama Scales, PA-C  budesonide-formoterol (SYMBICORT) 160-4.5 MCG/ACT inhaler Inhale 2 puffs into the lungs every 12 (twelve) hours. 05/01/22 11/25/22  Theadora Rama Scales, PA-C  carisoprodol (SOMA) 350 MG tablet Take 1 tablet (350 mg total) by mouth 3 (three) times daily. 11/25/22   Crain, Whitney L, PA  fluticasone (FLONASE) 50 MCG/ACT nasal spray Place 1 spray into both nostrils daily. 05/01/22   Theadora Rama Scales, PA-C  loratadine (CLARITIN) 10 MG tablet Take 1 tablet (10 mg total) by mouth daily. 05/01/22 11/25/22  Theadora Rama Scales, PA-C    Family History Family History  Problem Relation Age of Onset   Heart disease Father    Hypertension Father  Diabetes Father    Stroke Father    Heart attack Father    Hypertension Sister    Diabetes Brother    Hypertension Brother    Cancer Mother        Bone   Stroke Maternal Grandfather     Social History Social History   Tobacco Use   Smoking status: Never   Smokeless tobacco: Never  Vaping Use   Vaping status: Never Used  Substance Use Topics   Alcohol use: Not Currently    Alcohol/week: 0.0 standard drinks of alcohol   Drug use: Never     Allergies   Patient has no known allergies.   Review of Systems Review of Systems   Physical Exam Triage Vital Signs ED Triage Vitals  Encounter Vitals Group     BP      Systolic BP Percentile      Diastolic BP Percentile      Pulse      Resp      Temp      Temp src      SpO2      Weight      Height      Head Circumference      Peak Flow      Pain Score       Pain Loc      Pain Education      Exclude from Growth Chart    No data found.  Updated Vital Signs BP (!) 143/96   Pulse 94   Temp 98.5 F (36.9 C)   Resp 20   SpO2 98%    Physical Exam Vitals and nursing note reviewed.  Constitutional:      Appearance: Normal appearance. She is obese. She is ill-appearing.  HENT:     Head: Normocephalic and atraumatic.     Right Ear: Tympanic membrane, ear canal and external ear normal.     Left Ear: Tympanic membrane, ear canal and external ear normal.     Mouth/Throat:     Mouth: Mucous membranes are moist.     Pharynx: Oropharynx is clear.  Eyes:     Extraocular Movements: Extraocular movements intact.     Conjunctiva/sclera: Conjunctivae normal.     Pupils: Pupils are equal, round, and reactive to light.  Cardiovascular:     Rate and Rhythm: Normal rate and regular rhythm.     Pulses: Normal pulses.     Heart sounds: Normal heart sounds.  Pulmonary:     Effort: Pulmonary effort is normal.     Breath sounds: Normal breath sounds. No wheezing, rhonchi or rales.     Comments: Infrequent nonproductive cough on exam Chest:     Chest wall: No tenderness.  Musculoskeletal:        General: Normal range of motion.     Cervical back: Normal range of motion and neck supple.  Skin:    General: Skin is warm and dry.  Neurological:     General: No focal deficit present.     Mental Status: She is alert and oriented to person, place, and time. Mental status is at baseline.  Psychiatric:        Mood and Affect: Mood normal.        Behavior: Behavior normal.      UC Treatments / Results  Labs (all labs ordered are listed, but only abnormal results are displayed) Labs Reviewed - No data to display  EKG   Radiology No results found.  Procedures  Procedures (including critical care time)  Medications Ordered in UC Medications - No data to display  Initial Impression / Assessment and Plan / UC Course  I have reviewed the triage  vital signs and the nursing notes.  Pertinent labs & imaging results that were available during my care of the patient were reviewed by me and considered in my medical decision making (see chart for details).     MDM: 1.  COVID-19 Rx'd Paxlovid (300/100) 20 x 150 mg & 10 x 100 mg TBPK; 2.  Cough, unspecified type-Rx'd prednisone 20 mg tablet: Take 3 tablets p.o. daily x 5 days, Tessalon 200 mg capsule: Take 3 tabs p.o. daily, as needed, Hycodan 5-1.5 mg / 5 mL syrup: Take 5 mL every 6 hours as needed for cough. Advised patient to take prednisone with first dose of Paxlovid for the next 5 days.  Advised may use Tessalon capsules daily or as needed for cough.  Advised may use Hycodan cough syrup at night prior to sleep for cough due to sedative effects.  Encouraged to increase daily water intake to 64 ounces per day while taking these medications.  Advised if symptoms worsen and/or unresolved please follow-up with PCP or here for further evaluation.  Work note provided to patient prior to discharge today.  Patient discharged home, hemodynamically stable. Final Clinical Impressions(s) / UC Diagnoses   Final diagnoses:  COVID-19  Cough, unspecified type     Discharge Instructions      Advised patient to take medications as directed with food to completion.  Advised patient to take prednisone with first dose of Paxlovid for the next 5 days.  Advised may use Tessalon capsules daily or as needed for cough.  Advised may use Hycodan cough syrup at night prior to sleep for cough due to sedative effects.  Encouraged to increase daily water intake to 64 ounces per day while taking these medications.  Advised if symptoms worsen and/or unresolved please follow-up with PCP or here for further evaluation.     ED Prescriptions     Medication Sig Dispense Auth. Provider   nirmatrelvir/ritonavir (PAXLOVID, 300/100,) 20 x 150 MG & 10 x 100MG  TBPK Take 3 tablets by mouth 2 (two) times daily for 5 days. Patient  GFR is >60. Take nirmatrelvir (150 mg) two tablets twice daily for 5 days and ritonavir (100 mg) one tablet twice daily for 5 days. 30 tablet Trevor Iha, FNP   predniSONE (DELTASONE) 20 MG tablet Take 3 tabs PO daily x 5 days. 15 tablet Trevor Iha, FNP   benzonatate (TESSALON) 200 MG capsule Take 1 capsule (200 mg total) by mouth 3 (three) times daily as needed for up to 7 days. 40 capsule Trevor Iha, FNP   HYDROcodone bit-homatropine (HYCODAN) 5-1.5 MG/5ML syrup Take 5 mLs by mouth every 6 (six) hours as needed for cough. 120 mL Trevor Iha, FNP      I have reviewed the PDMP during this encounter.   Trevor Iha, FNP 06/16/23 1206

## 2023-06-16 NOTE — ED Triage Notes (Signed)
 Pt presents to uc with co sore throat since Sunday and positive covid today. Pt is concerned bc she is asthmatic

## 2023-06-16 NOTE — Discharge Instructions (Addendum)
 Advised patient to take medications as directed with food to completion.  Advised patient to take prednisone with first dose of Paxlovid for the next 5 days.  Advised may use Tessalon capsules daily or as needed for cough.  Advised may use Hycodan cough syrup at night prior to sleep for cough due to sedative effects.  Encouraged to increase daily water intake to 64 ounces per day while taking these medications.  Advised if symptoms worsen and/or unresolved please follow-up with PCP or here for further evaluation.

## 2023-07-27 ENCOUNTER — Other Ambulatory Visit: Payer: Self-pay

## 2023-07-27 ENCOUNTER — Encounter: Payer: Self-pay | Admitting: Allergy

## 2023-07-27 ENCOUNTER — Ambulatory Visit: Admitting: Allergy

## 2023-07-27 VITALS — BP 122/80 | HR 80 | Temp 97.7°F | Resp 16 | Ht 66.14 in | Wt 317.1 lb

## 2023-07-27 DIAGNOSIS — H1013 Acute atopic conjunctivitis, bilateral: Secondary | ICD-10-CM

## 2023-07-27 DIAGNOSIS — J4540 Moderate persistent asthma, uncomplicated: Secondary | ICD-10-CM

## 2023-07-27 DIAGNOSIS — J310 Chronic rhinitis: Secondary | ICD-10-CM | POA: Diagnosis not present

## 2023-07-27 DIAGNOSIS — H109 Unspecified conjunctivitis: Secondary | ICD-10-CM

## 2023-07-27 MED ORDER — LEVALBUTEROL TARTRATE 45 MCG/ACT IN AERO: 2.0000 | INHALATION_SPRAY | Freq: Four times a day (QID) | RESPIRATORY_TRACT | 1 refills | Status: AC | PRN

## 2023-07-27 MED ORDER — FLUTICASONE-SALMETEROL 230-21 MCG/ACT IN AERO: 2.0000 | INHALATION_SPRAY | Freq: Two times a day (BID) | RESPIRATORY_TRACT | 5 refills | Status: DC

## 2023-07-27 MED ORDER — OLOPATADINE HCL 0.2 % OP SOLN: 1.0000 [drp] | Freq: Every day | OPHTHALMIC | 5 refills | Status: AC | PRN

## 2023-07-27 NOTE — Patient Instructions (Addendum)
 Allergic rhinitis with conjunctivitis Chronic allergic rhinitis with symptoms exacerbated by air quality changes. Current antihistamine Claritin  tolerated but may be less effective. Zyrtec  causes drowsiness. Discussed Xyzal as a potential alternative. Reviewed nasal spray options including Ryaltris. - Trial Xyzal 5mg  daily for effectiveness and tolerability. - Use Ryaltris nasal spray 2 sprays each nostril twice a day for nasal congestion or drainage control.  This is a combination spray with nasal steroid, mometasone, for congestion control and nasal antihistamine, olopatadine , for drainage control.   With using nasal sprays point tip of bottle toward eye on same side nostril and lean head slightly forward for best technique.   - For itchy/watery eyes use Pataday  1 drop each eye daily as needed - Schedule skin testing visit and hold antihistamines for 3 days prior  Asthma Childhood asthma with exacerbations from environmental triggers.  Albuterol  causes palpitations/jitteriness. Discussed Xopenex  as an alternative rescue inhaler with fewer cardiac side effects.  - Spacer sample and demonstration provided. - Daily controller medication(s): Advair HFA 230mcg (this is a pump inhaler) two puffs twice daily with spacer - Prior to physical activity: Xopenex  2 puffs 10-15 minutes before physical activity. - Rescue medications: Xopenex  2 puffs every 4-6 hours as needed  - Asthma control goals:  * Full participation in all desired activities (may need albuterol  before activity) * Albuterol  use two time or less a week on average (not counting use with activity) * Cough interfering with sleep two time or less a month * Oral steroids no more than once a year * No hospitalizations  Schedule skin testing visit and hold antihistamines for 3 days prior Routine follow-up in 3-4 months

## 2023-07-27 NOTE — Addendum Note (Signed)
 Addended by: Merwyn Achilles on: 07/27/2023 04:19 PM   Modules accepted: Orders

## 2023-07-27 NOTE — Progress Notes (Signed)
 New Patient Note  RE: Tiziana Cislo MRN: [id number] DOB: [birth year] Date of Office Visit: 07/27/2023  Primary care provider: Josepha Nickels, DO  Chief Complaint: asthma  History of present illness: Alexandra Morgan is a 43 y.o. female presenting today for evaluation of asthma and allergies.  Discussed the use of AI scribe software for clinical note transcription with the patient, who gave verbal consent to proceed.  She has had asthma since childhood, which was well-controlled until she moved into an apartment with mildew. Her symptoms include coughing, wheezing, chest tightness, and shortness of breath, triggered also by smoke exposure, strong fragrances, and certain weather conditions.   She currently uses Symbicort , two puffs twice a day, but often only takes it in the morning due to cost concerns.  She states it cost $170.  She previously used Flovent  but switched due to insurance coverage/cost. She has used albuterol  as a rescue inhaler but dislikes the side effects, such as feeling her heart racing and jitteriness. She has also tried Advair discus in the past, which worsened her symptoms.  Her allergies manifest as itchy, burning eyes, frequent sneezing, runny nose, and congestion, particularly when air quality changes. She takes Claritin , which she finds tolerable, but Zyrtec  causes drowsiness. She also can't take Allegra  as this caused her to feel sick with URI symptoms.  She has not tried Xyzal. She also uses Flonase  for nasal symptoms.  She has a history of eczema, which runs in her family, and avoids tomatoes and orange juice and other citrus as this causes itchiness and throat dryness. She recalls a past reaction to lemonade causing hand swelling.  Review of systems: 10pt ROS negative unless noted above in HPI  Past medical history: Past Medical History:  Diagnosis Date   Anemia    Asthma    Chlamydia 05/06/2014   Eczema    Fibroid    HPV test positive 04/2014, 04/2015    normal cytology, negative subtype 16, 18/45. Recommend repeat Pap smear/HPV one year   Migraines     Past surgical history: Past Surgical History:  Procedure Laterality Date   ABDOMINAL SURGERY  05/10   ABDOMINAL MYOMECTOMY Dr Grafton Lawrence   DENTAL SURGERY     INCISION AND DRAINAGE BREAST ABSCESS      Family history:  Family History  Problem Relation Age of Onset   Urticaria Mother    Allergic rhinitis Mother    Cancer Mother        Bone   Heart disease Father    Hypertension Father    Diabetes Father    Stroke Father    Heart attack Father    Hypertension Sister    Diabetes Brother    Hypertension Brother    Asthma Brother    Stroke Maternal Grandfather     Social history: Lives in an apartment with carpeting in the bedroom with electric heating and central cooling.  There is concern for water damage/mildew in his apartment.  No concern for roaches.  No pets in the home.  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  Denies a smoking history.   Medication List: Current Outpatient Medications  Medication Sig Dispense Refill   albuterol  (VENTOLIN  HFA) 108 (90 Base) MCG/ACT inhaler Inhale 2 puffs into the lungs every 6 (six) hours as needed for wheezing or shortness of breath (Cough). 54 g 2   fluticasone  (FLONASE ) 50 MCG/ACT nasal spray Place 1 spray into both nostrils  daily. 47.4 mL 1   budesonide -formoterol  (SYMBICORT ) 160-4.5 MCG/ACT inhaler Inhale 2 puffs into the lungs every 12 (twelve) hours. 3 each 1   loratadine  (CLARITIN ) 10 MG tablet Take 1 tablet (10 mg total) by mouth daily. 90 tablet 1   No current facility-administered medications for this visit.    Known medication allergies: No Known Allergies   Physical examination: Blood pressure 122/80, pulse 80, temperature 97.7 F (36.5 C), resp. rate 16, height 5' 6.14" (1.68 m), weight (!) 317 lb 1.6 oz (143.8 kg), SpO2 100%.  General: Alert, interactive, in  no acute distress. HEENT: PERRLA, TMs pearly gray, turbinates mildly edematous without discharge, post-pharynx non erythematous. Neck: Supple without lymphadenopathy. Lungs: Clear to auscultation without wheezing, rhonchi or rales. {no increased work of breathing. CV: Normal S1, S2 without murmurs. Abdomen: Nondistended, nontender. Skin: Warm and dry, without lesions or rashes. Extremities:  No clubbing, cyanosis or edema. Neuro:   Grossly intact.  Diagnositics/Labs:  Spirometry: FEV1: 2.14L 80%, FVC: 2.41L 73%, ratio consistent with essentially nonobstructive pattern; FVC is slightly lower than expected   Assessment and plan: Allergic rhinitis with conjunctivitis Chronic allergic rhinitis with symptoms exacerbated by air quality changes. Current antihistamine Claritin  tolerated but may be less effective. Zyrtec  causes drowsiness. Discussed Xyzal as a potential alternative. Reviewed nasal spray options including Ryaltris. - Trial Xyzal 5mg  daily for effectiveness and tolerability. - Use Ryaltris nasal spray 2 sprays each nostril twice a day for nasal congestion or drainage control.  This is a combination spray with nasal steroid, mometasone, for congestion control and nasal antihistamine, olopatadine , for drainage control.   With using nasal sprays point tip of bottle toward eye on same side nostril and lean head slightly forward for best technique.   - For itchy/watery eyes use Pataday  1 drop each eye daily as needed - Schedule skin testing visit and hold antihistamines for 3 days prior  Asthma Childhood asthma with exacerbations from environmental triggers.  Albuterol  causes palpitations/jitteriness. Discussed Xopenex  as an alternative rescue inhaler with fewer cardiac side effects.  - Spacer sample and demonstration provided. - Daily controller medication(s): Advair HFA 230mcg (this is a pump inhaler) two puffs twice daily with spacer - Prior to physical activity: Xopenex  2 puffs  10-15 minutes before physical activity. - Rescue medications: Xopenex  2 puffs every 4-6 hours as needed  - Asthma control goals:  * Full participation in all desired activities (may need albuterol  before activity) * Albuterol  use two time or less a week on average (not counting use with activity) * Cough interfering with sleep two time or less a month * Oral steroids no more than once a year * No hospitalizations  Schedule skin testing visit and hold antihistamines for 3 days prior Routine follow-up in 3-4 months  (Env 1-55 + Tomato, orange, lemon)   I appreciate the opportunity to take part in Annalycia's care. Please do not hesitate to contact me with questions.  Sincerely,   Catha Clink, MD Allergy/Immunology [facility]

## 2023-08-04 ENCOUNTER — Encounter: Payer: Self-pay | Admitting: Allergy

## 2023-08-04 ENCOUNTER — Telehealth: Payer: Self-pay | Admitting: Allergy

## 2023-08-04 NOTE — Telephone Encounter (Signed)
 Alexandra Morgan was in here to get allergy testing but had to leave to go back to work.  Before she left she stated that the Advair and Xopenex  that was called in was too expensive.  Alexandra Morgan would like something that is covered  under her insurance to be called in to [facility] in [place],

## 2023-08-04 NOTE — Telephone Encounter (Signed)
 Called patient - DOB/DPR verified - advised/explained to her that she will need to contact her insurance for alternatives and most cost effective for  Advair and Xopenex  - she can send the provider a myChart message with alternatives.   Patient verbalized understanding to all, no further questions.

## 2023-08-09 NOTE — Progress Notes (Signed)
 Entered in error.  Patient not able to stay at this visit for his testing and rescheduled. This encounter was created in error - please disregard.

## 2023-08-11 ENCOUNTER — Encounter: Payer: Self-pay | Admitting: Family Medicine

## 2023-08-24 ENCOUNTER — Telehealth: Payer: Self-pay

## 2023-08-24 NOTE — Telephone Encounter (Signed)
 Err

## 2023-08-26 ENCOUNTER — Ambulatory Visit: Admitting: Allergy

## 2023-09-16 ENCOUNTER — Telehealth: Payer: Self-pay | Admitting: Allergy

## 2023-09-16 NOTE — Telephone Encounter (Signed)
 Alexandra Morgan came in and stated she received a call this morning asking her to bring in forms from her insurance that showed her inhalers in formulary.  I made copies of those and placed them on Dr. Gerold Kos desk on the injection side.  Alexandra Morgan states she needs a 90 day supply of Ryaltris.  She states she was given a sample and liked it.  Also, Alexandra Morgan states that she is ok with keeping the Levalbuterol  (Xopenex ) but if Dr. Tempie Fee would please find an alternate for Symbicort .  Alexandra Morgan is requesting 90 day refills on these medications as well and would like them sent to [facility] in [place].

## 2023-09-20 MED ORDER — RYALTRIS 665-25 MCG/ACT NA SUSP: NASAL | 1 refills | Status: DC

## 2023-09-20 NOTE — Telephone Encounter (Signed)
 Per Provider:  Yes please send in the 90 day supply for Ryaltris.    I will see the formulary list and see what inhaler is covered when I am back in office Wednesday to replace the Symbicort .   Called patient - DOB/DPR/Pharmacy verified - advised of provider notation above.  Reviewed w/patient sending prescription to [facility] pharmacy.  Patient stated to send to [facility] to see if cost will be cheaper.

## 2023-09-21 ENCOUNTER — Other Ambulatory Visit (HOSPITAL_COMMUNITY): Payer: Self-pay

## 2023-09-21 ENCOUNTER — Telehealth: Payer: Self-pay

## 2023-09-21 NOTE — Telephone Encounter (Signed)
*  AA  Pharmacy Patient Advocate Encounter   Received notification from Fax that prior authorization for Advair HFA 230-21MCG/ACT aerosol  is required/requested.   Insurance verification completed.   The patient is insured through [employer] .   Per test claim: PA required; PA started via CoverMyMeds. KEY [id number] . Waiting for clinical questions to populate.

## 2023-09-21 NOTE — Telephone Encounter (Addendum)
 Per Provider:  Reviewed insurance information provided for replacing the Symbicort .  It appears that Dulera is covered so lets do that. Dulera 200mcg - 2 puffs twice a day   Called patient - DOB/DPR verified - LMOVM regarding above provider notation - need to know which pharmacy she would like the prescription sent.  If/When patient call back - please advise of above notation.

## 2023-09-21 NOTE — Addendum Note (Signed)
 Addended by: Bari Boos B on: 09/21/2023 03:13 PM   Modules accepted: Orders

## 2023-10-06 ENCOUNTER — Encounter: Payer: Self-pay | Admitting: Allergy

## 2023-10-06 ENCOUNTER — Other Ambulatory Visit: Payer: Self-pay | Admitting: *Deleted

## 2023-10-06 MED ORDER — BUDESONIDE-FORMOTEROL FUMARATE 160-4.5 MCG/ACT IN AERO: 2.0000 | INHALATION_SPRAY | Freq: Two times a day (BID) | RESPIRATORY_TRACT | 3 refills | Status: DC

## 2023-10-10 ENCOUNTER — Other Ambulatory Visit (HOSPITAL_COMMUNITY): Payer: Self-pay

## 2023-10-10 ENCOUNTER — Telehealth: Payer: Self-pay

## 2023-10-10 NOTE — Telephone Encounter (Signed)
 Pharmacy Patient Advocate Encounter  Received notification from [facility] that Prior Authorization for Fluticasone -Salmeterol 230-21mcg HFA AER AD has been DENIED.  Full denial letter will be uploaded to the media tab. See denial reason below.  Insurance requires Brand name usage. Brand also requires prior authorization.  PA #/Case ID/Reference #: [id number]

## 2023-10-19 ENCOUNTER — Encounter: Payer: Self-pay | Admitting: Allergy

## 2023-10-19 ENCOUNTER — Ambulatory Visit (INDEPENDENT_AMBULATORY_CARE_PROVIDER_SITE_OTHER): Admitting: Allergy

## 2023-10-19 DIAGNOSIS — J302 Other seasonal allergic rhinitis: Secondary | ICD-10-CM

## 2023-10-19 DIAGNOSIS — J3089 Other allergic rhinitis: Secondary | ICD-10-CM | POA: Diagnosis not present

## 2023-10-19 DIAGNOSIS — H1013 Acute atopic conjunctivitis, bilateral: Secondary | ICD-10-CM | POA: Diagnosis not present

## 2023-10-19 NOTE — Telephone Encounter (Signed)
 Patient was in the office today and was informed that Budesonide -Formoterol  has been approved from 10/10/2023 to 10/28/2025

## 2023-10-19 NOTE — Progress Notes (Signed)
 Follow-up Note  RE: Alexandra Morgan MRN: [id number] DOB: 1981/02/15 Date of Office Visit: 10/19/2023   History of present illness: Alexandra Morgan is a 43 y.o. female presenting today for skin testing visit.  She was last seen in the office on 07/27/23 for asthma, allergic rhinitis with conjunctivitis.  She is in her usual state of health today without recent illness.  She has held antihistamines for at least 3 days for testing today.   Medication List: Current Outpatient Medications  Medication Sig Dispense Refill   albuterol  (VENTOLIN  HFA) 108 (90 Base) MCG/ACT inhaler Inhale 2 puffs into the lungs every 6 (six) hours as needed for wheezing or shortness of breath (Cough). 54 g 2   budesonide -formoterol  (SYMBICORT ) 160-4.5 MCG/ACT inhaler Inhale 2 puffs into the lungs every 12 (twelve) hours. 1 each 3   fluticasone  (FLONASE ) 50 MCG/ACT nasal spray Place 1 spray into both nostrils daily. 47.4 mL 1   fluticasone -salmeterol (ADVAIR HFA) 230-21 MCG/ACT inhaler Inhale 2 puffs into the lungs 2 (two) times daily. 1 each 5   levalbuterol  (XOPENEX  HFA) 45 MCG/ACT inhaler Inhale 2 puffs into the lungs 4 (four) times daily as needed for wheezing or shortness of breath. every 4-6 hours as needed 15 g 1   loratadine  (CLARITIN ) 10 MG tablet Take 1 tablet (10 mg total) by mouth daily. 90 tablet 1   Olopatadine  HCl (PATADAY ) 0.2 % SOLN Place 1 drop into both eyes daily as needed (Itchy, watery eyes). 2.5 mL 5   Olopatadine -Mometasone (RYALTRIS ) 665-25 MCG/ACT SUSP 2 sprays each nostril 2(Times) daily for nasal congestion or drainage control. 87 g 1   No current facility-administered medications for this visit.     Known medication allergies: No Known Allergies  Diagnostics/Labs:  Allergy testing:   Airborne Adult Perc - 10/19/23 0852     Time Antigen Placed 9146    Allergen Manufacturer Jestine    Location Back    Number of Test 55    1. Control-Buffer 50% Glycerol Negative    2. Control-Histamine  2+    3. Bahia Negative    4. French Southern Territories Negative    5. Johnson Negative    6. Kentucky  Blue Negative    7. Meadow Fescue Negative    8. Perennial Rye Negative    9. Timothy Negative    10. Ragweed Mix Negative    11. Cocklebur Negative    12. Plantain,  English Negative    13. Baccharis Negative    14. Dog Fennel 2+    15. Russian Thistle Negative    16. Lamb's Quarters Negative    17. Sheep Sorrell Negative    18. Rough Pigweed Negative    19. Marsh Elder, Rough Negative    20. Mugwort, Common Negative    21. Box, Elder Negative    22. Cedar, red 2+    23. Sweet Gum Negative    24. Pecan Pollen Negative    25. Pine Mix Negative    26. Walnut, Black Pollen 2+    27. Red Mulberry Negative    28. Ash Mix Negative    29. Birch Mix Negative    30. Beech American 2+    31. Cottonwood, Guinea-Bissau Negative    32. Hickory, White Negative    33. Maple Mix Negative    34. Oak, Guinea-[place]issau Mix Negative    35. Sycamore Eastern Negative    36. Alternaria Alternata 2+    37. Cladosporium Herbarum Negative    38. Aspergillus  Mix 2+    39. Penicillium Mix 2+    40. Bipolaris Sorokiniana (Helminthosporium) Negative    41. Drechslera Spicifera (Curvularia) Negative    42. Mucor Plumbeus Negative    43. Fusarium Moniliforme Negative    44. Aureobasidium Pullulans (pullulara) 2+    45. Rhizopus Oryzae Negative    46. Botrytis Cinera Negative    47. Epicoccum Nigrum Negative    48. Phoma Betae Negative    49. Dust Mite Mix Negative    50. Cat Hair 10,000 BAU/ml Negative    51.  Dog Epithelia Negative    52. Mixed Feathers 2+    53. Horse Epithelia Negative    54. Cockroach, German Negative    55. Tobacco Leaf Negative          Intradermal - 10/19/23 0905     Time Antigen Placed 9094    Allergen Manufacturer Jestine    Location Arm    Number of Test 8    Control Negative    7 Grass Negative    Ragweed Mix Negative    Mold 3 2+    Mite Mix Negative    Cat 2+    Dog Negative     Cockroach 2+          Food Adult Perc - 10/19/23 0800     Time Antigen Placed 9146    Allergen Manufacturer Jestine    Location Back    Number of allergen test 3    38. Tomato Negative    55. Orange  Negative    56. Lemon Negative          Allergy testing results were read and interpreted by provider, documented by clinical staff.   Assessment and plan: Allergic rhinitis with conjunctivitis Chronic allergic rhinitis with symptoms exacerbated by air quality changes.  Claritin  tolerated but not very effective. Zyrtec  causes drowsiness.  - Use Xyzal 5mg  daily for effectiveness and tolerability. - Use Ryaltris  nasal spray 2 sprays each nostril twice a day for nasal congestion or drainage control.  This is a combination spray with nasal steroid, mometasone, for congestion control and nasal antihistamine, olopatadine , for drainage control.   With using nasal sprays point tip of bottle toward eye on same side nostril and lean head slightly forward for best technique.   - For itchy/watery eyes use Pataday  1 drop each eye daily as needed  - Testing today showed: weeds, trees, indoor molds, outdoor molds, cat, cockroach, and mixed feathers.  Negative to orange  - Copy of test results provided.  - Avoidance measures provided. - Consider allergy shots as a means of long-term control. - Allergy shots re-train and reset the immune system to ignore environmental allergens and decrease the resulting immune response to those allergens (sneezing, itchy watery eyes, runny nose, nasal congestion, etc).    - Allergy shots improve symptoms in 75-85% of patients.  - We can discuss more at a future appointment if the medications are not working for you.   Asthma Childhood asthma with exacerbations from environmental triggers.  Albuterol  causes palpitations/jitteriness.  - Daily controller medication(s): Symbicort  (this is a pump inhaler) two puffs twice daily with spacer - Prior to physical  activity: Xopenex  2 puffs 10-15 minutes before physical activity. - Rescue medications: Xopenex  2 puffs every 4-6 hours as needed  - Asthma control goals:  * Full participation in all desired activities (may need albuterol  before activity) * Albuterol  use two time or less a week on average (not  counting use with activity) * Cough interfering with sleep two time or less a month * Oral steroids no more than once a year * No hospitalizations  Routine follow-up in 3-4 months  I appreciate the opportunity to take part in Sumaiya's care. Please do not hesitate to contact me with questions.  Sincerely,   Danita Brain, MD Allergy/Immunology [facility]

## 2023-10-19 NOTE — Patient Instructions (Addendum)
 Allergic rhinitis with conjunctivitis Chronic allergic rhinitis with symptoms exacerbated by air quality changes.  Claritin  tolerated but not very effective. Zyrtec  causes drowsiness.  - Use Xyzal 5mg  daily for effectiveness and tolerability. - Use Ryaltris  nasal spray 2 sprays each nostril twice a day for nasal congestion or drainage control.  This is a combination spray with nasal steroid, mometasone, for congestion control and nasal antihistamine, olopatadine , for drainage control.   With using nasal sprays point tip of bottle toward eye on same side nostril and lean head slightly forward for best technique.   - For itchy/watery eyes use Pataday  1 drop each eye daily as needed  - Testing today showed: weeds, trees, indoor molds, outdoor molds, cat, cockroach, and mixed feathers.   Negative to orange, lemon, tomato.  - Copy of test results provided.  - Avoidance measures provided. - Consider allergy shots as a means of long-term control. - Allergy shots re-train and reset the immune system to ignore environmental allergens and decrease the resulting immune response to those allergens (sneezing, itchy watery eyes, runny nose, nasal congestion, etc).    - Allergy shots improve symptoms in 75-85% of patients.  - We can discuss more at a future appointment if the medications are not working for you.   Asthma Childhood asthma with exacerbations from environmental triggers.  Albuterol  causes palpitations/jitteriness.  - Daily controller medication(s): Symbicort  (this is a pump inhaler) two puffs twice daily with spacer - Prior to physical activity: Xopenex  2 puffs 10-15 minutes before physical activity. - Rescue medications: Xopenex  2 puffs every 4-6 hours as needed  - Asthma control goals:  * Full participation in all desired activities (may need albuterol  before activity) * Albuterol  use two time or less a week on average (not counting use with activity) * Cough interfering with sleep  two time or less a month * Oral steroids no more than once a year * No hospitalizations  Routine follow-up in 3-4 months

## 2023-12-29 ENCOUNTER — Telehealth: Payer: Self-pay

## 2023-12-29 ENCOUNTER — Other Ambulatory Visit (HOSPITAL_COMMUNITY): Payer: Self-pay

## 2023-12-29 NOTE — Telephone Encounter (Signed)
*  AA  Prompt PA  Prior Authorization request details: Prior Auth (EOC) ID: [id number] Drug/Service Name: LEVALBUTEROL  TAR HFA INH Patient: Alexandra Morgan Date Requested: 12/29/2023 12:41:06 PM   MemberID: [id number] DOB: [birth year]

## 2024-01-02 NOTE — Telephone Encounter (Signed)
 Approval through 12/31/2024

## 2024-01-15 ENCOUNTER — Encounter: Payer: Self-pay | Admitting: Emergency Medicine

## 2024-01-15 ENCOUNTER — Ambulatory Visit
Admission: EM | Admit: 2024-01-15 | Discharge: 2024-01-15 | Disposition: A | Discharge status: Home / Self Care | Attending: Family Medicine | Admitting: Family Medicine

## 2024-01-15 DIAGNOSIS — J4531 Mild persistent asthma with (acute) exacerbation: Secondary | ICD-10-CM | POA: Diagnosis not present

## 2024-01-15 MED ORDER — PREDNISONE 20 MG PO TABS: ORAL_TABLET | ORAL | 0 refills | Status: AC

## 2024-01-15 MED ORDER — METHYLPREDNISOLONE SODIUM SUCC 125 MG IJ SOLR
80.0000 mg | Freq: Once | INTRAMUSCULAR | Status: AC
  Administered 2024-01-15: 80 mg via INTRAMUSCULAR

## 2024-01-15 NOTE — ED Triage Notes (Signed)
 Patient c/o persistent cough x 3 weeks, history of asthma.  There is some congestion in the mornings, afebrile.  Patient has taken Tessalon  Perles x 1.

## 2024-01-15 NOTE — Discharge Instructions (Signed)
 Continue Symbicort  as directed Use Xopenex  to help reduce coughing Take prednisone  as directed See your allergy  specialist as scheduled

## 2024-01-15 NOTE — ED Provider Notes (Signed)
 [facility]    CSN: [id number] Arrival date & time: 01/15/24  0909      History   Chief Complaint Chief Complaint  Patient presents with   Cough    HPI Alexandra Morgan is a 43 y.o. female.   Patient has chronic asthma and allergies.  He is under the care of an asthma and allergy  specialist.  Has Singulair to use daily and Xopenex  to use as needed.  Is here for increased shortness of breath and coughing for the last week.  States she has not been using her Xopenex .  No fever or chills.  No sputum production.  No headache or bodyaches to suggest infection.  She thinks this is a flare of her asthma    Past Medical History:  Diagnosis Date   Anemia    Asthma    Chlamydia 05/06/2014   Eczema    Fibroid    HPV test positive 04/2014, 04/2015   normal cytology, negative subtype 16, 18/45. Recommend repeat Pap smear/HPV one year   Migraines     Patient Active Problem List   Diagnosis Date Noted   Morbid obesity (HCC) 11/11/2021   Prediabetes 11/11/2021   Iron deficiency anemia 11/11/2021   Screening mammogram for breast cancer 11/11/2021   Moderate persistent asthma without complication 11/11/2021   Breast pain 11/11/2021    Past Surgical History:  Procedure Laterality Date   ABDOMINAL SURGERY  05/10   ABDOMINAL MYOMECTOMY Dr Herb   DENTAL SURGERY     INCISION AND DRAINAGE BREAST ABSCESS      OB History     Gravida  1   Para      Term      Preterm      AB  1   Living  0      SAB      IAB  1   Ectopic      Multiple      Live Births               Home Medications    Prior to Admission medications   Medication Sig Start Date End Date Taking? Authorizing Provider  budesonide -formoterol  (SYMBICORT ) 160-4.5 MCG/ACT inhaler Inhale 2 puffs into the lungs every 12 (twelve) hours. 10/06/23 04/03/24 Yes Padgett, Danita Macintosh, MD  levalbuterol  (XOPENEX  HFA) 45 MCG/ACT inhaler Inhale 2 puffs into the lungs 4 (four) times daily as needed  for wheezing or shortness of breath. every 4-6 hours as needed 07/27/23  Yes Padgett, Danita Macintosh, MD  Olopatadine  HCl (PATADAY ) 0.2 % SOLN Place 1 drop into both eyes daily as needed (Itchy, watery eyes). 07/27/23  Yes Padgett, Danita Macintosh, MD  Olopatadine -Mometasone (RYALTRIS ) 665-25 MCG/ACT SUSP 2 sprays each nostril 2(Times) daily for nasal congestion or drainage control. 09/20/23  Yes Padgett, Danita Macintosh, MD  predniSONE  (DELTASONE ) 20 MG tablet Take 2 pills (40 mg) once a day with breakfast for the first 5 days.  Then reduce to 1 pill a day until gone 01/15/24  Yes Maranda, Jamee Jacob, MD    Family History Family History  Problem Relation Age of Onset   Urticaria Mother    Allergic rhinitis Mother    Cancer Mother        Bone   Heart disease Father    Hypertension Father    Diabetes Father    Stroke Father    Heart attack Father    Hypertension Sister    Diabetes Brother    Hypertension Brother  Asthma Brother    Stroke Maternal Grandfather     Social History Social History   Tobacco Use   Smoking status: Never    Passive exposure: Never   Smokeless tobacco: Never  Vaping Use   Vaping status: Never Used  Substance Use Topics   Alcohol use: Not Currently    Alcohol/week: 0.0 standard drinks of alcohol   Drug use: Never     Allergies   Patient has no known allergies.   Review of Systems Review of Systems See HPI  Physical Exam Triage Vital Signs ED Triage Vitals  Encounter Vitals Group     BP 01/15/24 0922 (!) 139/92     Girls Systolic BP Percentile --      Girls Diastolic BP Percentile --      Boys Systolic BP Percentile --      Boys Diastolic BP Percentile --      Pulse Rate 01/15/24 0922 88     Resp 01/15/24 0922 20     Temp 01/15/24 0922 98.8 F (37.1 C)     Temp Source 01/15/24 0922 Oral     SpO2 01/15/24 0922 96 %     Weight 01/15/24 0917 (!) 310 lb (140.6 kg)     Height 01/15/24 0917 5' 8 (1.727 m)     Head Circumference --       Peak Flow --      Pain Score 01/15/24 0917 0     Pain Loc --      Pain Education --      Exclude from Growth Chart --    No data found.  Updated Vital Signs BP (!) 139/92 (BP Location: Right Arm)   Pulse 88   Temp 98.8 F (37.1 C) (Oral)   Resp 20   Ht 5' 8 (1.727 m)   Wt (!) 139 kg   LMP 01/08/2024   SpO2 96%   BMI 46.58 kg/m       Physical Exam Constitutional:      General: She is not in acute distress.    Appearance: She is well-developed.  HENT:     Head: Normocephalic and atraumatic.     Right Ear: Tympanic membrane normal.     Left Ear: Tympanic membrane normal.     Nose: Nose normal.     Mouth/Throat:     Pharynx: No posterior oropharyngeal erythema.  Eyes:     Conjunctiva/sclera: Conjunctivae normal.     Pupils: Pupils are equal, round, and reactive to light.  Cardiovascular:     Rate and Rhythm: Normal rate.  Pulmonary:     Effort: Pulmonary effort is normal. No respiratory distress.     Breath sounds: Wheezing and rhonchi present.  Musculoskeletal:        General: Normal range of motion.     Cervical back: Normal range of motion.  Skin:    General: Skin is warm and dry.  Neurological:     Mental Status: She is alert.      UC Treatments / Results  Labs (all labs ordered are listed, but only abnormal results are displayed) Labs Reviewed - No data to display  EKG   Radiology No results found.  Procedures Procedures (including critical care time)  Medications Ordered in UC Medications  methylPREDNISolone  sodium succinate (SOLU-MEDROL ) 125 mg/2 mL injection 80 mg (80 mg Intramuscular Given 01/15/24 0945)    Initial Impression / Assessment and Plan / UC Course  I have reviewed the triage vital signs and  the nursing notes.  Pertinent labs & imaging results that were available during my care of the patient were reviewed by me and considered in my medical decision making (see chart for details).     Patient was offered an albuterol   nebulized treatment.  She states albuterol  makes her heart pounding, that is why she has Xopenex  at home.  Will treat her with steroids.  This has helped her in the past.  Continue using usual inhalers. Final Clinical Impressions(s) / UC Diagnoses   Final diagnoses:  Mild persistent asthma with exacerbation     Discharge Instructions      Continue Symbicort  as directed Use Xopenex  to help reduce coughing Take prednisone  as directed See your allergy  specialist as scheduled   ED Prescriptions     Medication Sig Dispense Auth. Provider   predniSONE  (DELTASONE ) 20 MG tablet Take 2 pills (40 mg) once a day with breakfast for the first 5 days.  Then reduce to 1 pill a day until gone 15 tablet Maranda Jamee Jacob, MD      PDMP not reviewed this encounter.   Maranda Jamee Jacob, MD 01/15/24 [phone]

## 2024-01-16 ENCOUNTER — Telehealth: Payer: Self-pay | Admitting: Emergency Medicine

## 2024-01-16 NOTE — Telephone Encounter (Signed)
 Spoke with patient, states that she's doing ok, still has cough but has started her medication.  Patient will follow up as needed.

## 2024-02-22 ENCOUNTER — Encounter: Payer: Self-pay | Admitting: Allergy

## 2024-02-22 ENCOUNTER — Other Ambulatory Visit: Payer: Self-pay

## 2024-02-22 ENCOUNTER — Ambulatory Visit: Admitting: Allergy

## 2024-02-22 VITALS — BP 112/70 | HR 94 | Temp 98.0°F | Resp 20

## 2024-02-22 DIAGNOSIS — J3089 Other allergic rhinitis: Secondary | ICD-10-CM | POA: Diagnosis not present

## 2024-02-22 DIAGNOSIS — Z23 Encounter for immunization: Secondary | ICD-10-CM | POA: Diagnosis not present

## 2024-02-22 DIAGNOSIS — J302 Other seasonal allergic rhinitis: Secondary | ICD-10-CM

## 2024-02-22 DIAGNOSIS — J4540 Moderate persistent asthma, uncomplicated: Secondary | ICD-10-CM

## 2024-02-22 NOTE — Progress Notes (Unsigned)
 Follow-up Note  RE: Alexandra Morgan MRN: [id number] DOB: 1980/08/15 Date of Office Visit: 02/22/2024   History of present illness: Alexandra Morgan is a 43 y.o. female presenting today for follow-up of asthma, allergic rhinitis with conjunctivitis.  She was last seen in the office 10/19/23 by myself.   Discussed the use of AI scribe software for clinical note transcription with the patient, who gave verbal consent to proceed.  She has been experiencing a persistent cough that recurred after completing a course of steroids last month. The cough is sometimes productive, especially in the mornings after using her nasal spray and inhaler, with mucus production. The cough seems worse at home, possibly due to the older apartment building she resides in, and she mentions potential environmental factors such as mildew at her workplace, a skilled nursing facility.  She has been using Symbicort , two puffs twice a day with a spacer. She has not used her rescue inhaler recently despite having it available. She received a steroid injection and a 10-day course of oral steroids from urgent care previously as mentioned above, which helped alleviate symptoms temporarily.  Her current medications include Symbicort  and Ryaltris  nasal spray, which she uses in the morning as part of her routine. She also has Xyzal, which she has taken once, noting it is more expensive than Zyrtec , which she finds sedating.   No recent hospitalizations or surgeries since July.     Review of systems: 10pt ROS negative unless noted in HPI  Past medical/social/surgical/family history have been reviewed and are unchanged unless specifically indicated below.  No changes  Medication List: Current Outpatient Medications  Medication Sig Dispense Refill   budesonide -formoterol  (SYMBICORT ) 160-4.5 MCG/ACT inhaler Inhale 2 puffs into the lungs every 12 (twelve) hours. 1 each 3   levalbuterol  (XOPENEX  HFA) 45 MCG/ACT inhaler Inhale 2 puffs  into the lungs 4 (four) times daily as needed for wheezing or shortness of breath. every 4-6 hours as needed 15 g 1   Olopatadine  HCl (PATADAY ) 0.2 % SOLN Place 1 drop into both eyes daily as needed (Itchy, watery eyes). 2.5 mL 5   Olopatadine -Mometasone (RYALTRIS ) 665-25 MCG/ACT SUSP 2 sprays each nostril 2(Times) daily for nasal congestion or drainage control. 87 g 1   predniSONE  (DELTASONE ) 20 MG tablet Take 2 pills (40 mg) once a day with breakfast for the first 5 days.  Then reduce to 1 pill a day until gone (Patient not taking: Reported on 02/22/2024) 15 tablet 0   No current facility-administered medications for this visit.     Known medication allergies: No Known Allergies   Physical examination: Blood pressure 112/70, pulse 94, temperature 98 F (36.7 C), resp. rate 20, SpO2 99%.  General: Alert, interactive, in no acute distress. HEENT: PERRLA, TMs pearly gray, turbinates minimally edematous without discharge, post-pharynx non erythematous. Neck: Supple without lymphadenopathy. Lungs: Clear to auscultation without wheezing, rhonchi or rales. {no increased work of breathing. CV: Normal S1, S2 without murmurs. Abdomen: Nondistended, nontender. Skin: Warm and dry, without lesions or rashes. Extremities:  No clubbing, cyanosis or edema. Neuro:   Grossly intact.  Diagnostics/Labs:  Spirometry: FEV1: 2.24L 85%, FVC: 2.58L 79%, ratio consistent with nonobstructive pattern  Assessment and plan: Allergic rhinitis with conjunctivitis Chronic allergic rhinitis with symptoms exacerbated by air quality changes.  Claritin  tolerated but not very effective. Zyrtec  causes drowsiness.  - Use Xyzal 5mg  daily for effectiveness and tolerability. - Use Ryaltris  nasal spray 2 sprays each nostril twice a day for nasal congestion or  drainage control.  This is a combination spray with nasal steroid, mometasone, for congestion control and nasal antihistamine, olopatadine , for drainage control.    With using nasal sprays point tip of bottle toward eye on same side nostril and lean head slightly forward for best technique.   - For itchy/watery eyes use Pataday  1 drop each eye daily as needed - Continue avoidance measures for weeds, trees, indoor molds, outdoor molds, cat, cockroach, and mixed feathers.   Negative to orange, lemon, tomato.   - Consider allergy  shots as a means of long-term control. - Allergy  shots re-train and reset the immune system to ignore environmental allergens and decrease the resulting immune response to those allergens (sneezing, itchy watery eyes, runny nose, nasal congestion, etc).    - Allergy  shots improve symptoms in 75-85% of patients.  - We can discuss more at a future appointment if the medications are not working for you.   Asthma Childhood asthma with exacerbations from environmental triggers.  Albuterol  causes palpitations/jitteriness.  - Daily controller medication(s):  Symbicort  two puffs twice daily with spacer Start for the next month Spiriva 2 puffs daily to help improve current symptoms (samples provided) - Prior to physical activity: Xopenex  2 puffs 10-15 minutes before physical activity. - Rescue medications: Xopenex  2 puffs every 4-6 hours as needed  - Asthma control goals:  * Full participation in all desired activities (may need albuterol  before activity) * Albuterol  use two time or less a week on average (not counting use with activity) * Cough interfering with sleep two time or less a month * Oral steroids no more than once a year * No hospitalizations  Routine follow-up in 4-6 months  I appreciate the opportunity to take part in Alexandra Morgan's care. Please do not hesitate to contact me with questions.  Sincerely,   Danita Brain, MD Allergy /Immunology [facility]

## 2024-02-22 NOTE — Patient Instructions (Addendum)
 Allergic rhinitis with conjunctivitis Chronic allergic rhinitis with symptoms exacerbated by air quality changes.  Claritin  tolerated but not very effective. Zyrtec  causes drowsiness.  - Use Xyzal 5mg  daily for effectiveness and tolerability. - Use Ryaltris  nasal spray 2 sprays each nostril twice a day for nasal congestion or drainage control.  This is a combination spray with nasal steroid, mometasone, for congestion control and nasal antihistamine, olopatadine , for drainage control.   With using nasal sprays point tip of bottle toward eye on same side nostril and lean head slightly forward for best technique.   - For itchy/watery eyes use Pataday  1 drop each eye daily as needed - Continue avoidance measures for weeds, trees, indoor molds, outdoor molds, cat, cockroach, and mixed feathers.   Negative to orange, lemon, tomato.  - Copy of test results provided.  - Avoidance measures provided. - Consider allergy  shots as a means of long-term control. - Allergy  shots re-train and reset the immune system to ignore environmental allergens and decrease the resulting immune response to those allergens (sneezing, itchy watery eyes, runny nose, nasal congestion, etc).    - Allergy  shots improve symptoms in 75-85% of patients.  - We can discuss more at a future appointment if the medications are not working for you.   Asthma Childhood asthma with exacerbations from environmental triggers.  Albuterol  causes palpitations/jitteriness.  - Daily controller medication(s):  Symbicort  (this is a pump inhaler) two puffs twice daily with spacer Start for the next month Spiriva 2 puffs daily to help improve current symptoms - Prior to physical activity: Xopenex  2 puffs 10-15 minutes before physical activity. - Rescue medications: Xopenex  2 puffs every 4-6 hours as needed  - Asthma control goals:  * Full participation in all desired activities (may need albuterol  before activity) * Albuterol  use two time  or less a week on average (not counting use with activity) * Cough interfering with sleep two time or less a month * Oral steroids no more than once a year * No hospitalizations  Routine follow-up in 4-6 months

## 2024-02-23 MED ORDER — BUDESONIDE-FORMOTEROL FUMARATE 160-4.5 MCG/ACT IN AERO: 2.0000 | INHALATION_SPRAY | Freq: Two times a day (BID) | RESPIRATORY_TRACT | 3 refills | Status: AC

## 2024-02-23 MED ORDER — RYALTRIS 665-25 MCG/ACT NA SUSP: NASAL | 1 refills | Status: AC

## 2024-06-21 ENCOUNTER — Ambulatory Visit: Admitting: Allergy
